# Patient Record
Sex: Male | Born: 1935 | Race: White | Hispanic: No | Marital: Married | Smoking: Former smoker
Health system: Southern US, Community
[De-identification: ages and names within clinical notes are randomized; demographics above are authoritative.]

## PROBLEM LIST (undated history)

## (undated) DIAGNOSIS — R531 Weakness: Secondary | ICD-10-CM

## (undated) HISTORY — DX: Weakness: R53.1

---

## 2001-06-15 ENCOUNTER — Encounter: Admission: RE | Admit: 2001-06-15 | Discharge: 2001-06-15 | Payer: Self-pay | Admitting: Family Medicine

## 2001-06-15 ENCOUNTER — Encounter: Payer: Self-pay | Admitting: Family Medicine

## 2003-01-20 ENCOUNTER — Encounter: Admission: RE | Admit: 2003-01-20 | Discharge: 2003-01-20 | Payer: Self-pay | Admitting: Family Medicine

## 2003-01-20 ENCOUNTER — Encounter: Payer: Self-pay | Admitting: Family Medicine

## 2006-06-21 ENCOUNTER — Encounter: Admission: RE | Admit: 2006-06-21 | Discharge: 2006-06-21 | Payer: Self-pay | Admitting: Family Medicine

## 2009-06-17 ENCOUNTER — Encounter: Admission: RE | Admit: 2009-06-17 | Discharge: 2009-06-17 | Payer: Self-pay | Admitting: Family Medicine

## 2009-08-13 ENCOUNTER — Inpatient Hospital Stay (HOSPITAL_COMMUNITY): Admission: EM | Admit: 2009-08-13 | Discharge: 2009-08-18 | Payer: Self-pay | Admitting: Emergency Medicine

## 2009-10-14 ENCOUNTER — Encounter: Admission: RE | Admit: 2009-10-14 | Discharge: 2009-10-14 | Payer: Self-pay | Admitting: Neurology

## 2009-11-03 ENCOUNTER — Encounter: Admission: RE | Admit: 2009-11-03 | Discharge: 2010-02-02 | Payer: Self-pay | Admitting: Family Medicine

## 2010-02-03 ENCOUNTER — Encounter: Admission: RE | Admit: 2010-02-03 | Discharge: 2010-05-06 | Payer: Self-pay | Admitting: Family Medicine

## 2010-05-07 ENCOUNTER — Encounter
Admission: RE | Admit: 2010-05-07 | Discharge: 2010-08-05 | Payer: Self-pay | Source: Home / Self Care | Admitting: Family Medicine

## 2010-12-07 LAB — CBC
HCT: 31.4 % — ABNORMAL LOW (ref 39.0–52.0)
HCT: 33.9 % — ABNORMAL LOW (ref 39.0–52.0)
Hemoglobin: 10.7 g/dL — ABNORMAL LOW (ref 13.0–17.0)
Hemoglobin: 11.2 g/dL — ABNORMAL LOW (ref 13.0–17.0)
Hemoglobin: 11.3 g/dL — ABNORMAL LOW (ref 13.0–17.0)
MCHC: 33.4 g/dL (ref 30.0–36.0)
MCHC: 34.2 g/dL (ref 30.0–36.0)
MCV: 99.5 fL (ref 78.0–100.0)
Platelets: 80 10*3/uL — ABNORMAL LOW (ref 150–400)
Platelets: 94 10*3/uL — ABNORMAL LOW (ref 150–400)
RBC: 3.17 MIL/uL — ABNORMAL LOW (ref 4.22–5.81)
RBC: 3.41 MIL/uL — ABNORMAL LOW (ref 4.22–5.81)
RDW: 14.7 % (ref 11.5–15.5)
RDW: 14.9 % (ref 11.5–15.5)
RDW: 15 % (ref 11.5–15.5)
WBC: 4.4 10*3/uL (ref 4.0–10.5)
WBC: 4.7 10*3/uL (ref 4.0–10.5)

## 2010-12-07 LAB — COMPREHENSIVE METABOLIC PANEL
ALT: 18 U/L (ref 0–53)
ALT: 21 U/L (ref 0–53)
AST: 17 U/L (ref 0–37)
AST: 18 U/L (ref 0–37)
AST: 20 U/L (ref 0–37)
Albumin: 3.5 g/dL (ref 3.5–5.2)
BUN: 16 mg/dL (ref 6–23)
CO2: 28 mEq/L (ref 19–32)
CO2: 29 mEq/L (ref 19–32)
Calcium: 8.9 mg/dL (ref 8.4–10.5)
Chloride: 101 mEq/L (ref 96–112)
Creatinine, Ser: 0.82 mg/dL (ref 0.4–1.5)
GFR calc Af Amer: >60 mL/min (ref >60)
GFR calc Af Amer: >60 mL/min (ref >60)
GFR calc non Af Amer: >60 mL/min (ref >60)
Glucose, Bld: 105 mg/dL — ABNORMAL HIGH (ref 70–99)
Potassium: 4 mEq/L (ref 3.5–5.1)
Sodium: 135 mEq/L (ref 135–145)
Sodium: 142 mEq/L (ref 135–145)
Total Bilirubin: 0.6 mg/dL (ref 0.3–1.2)
Total Protein: 5.3 g/dL — ABNORMAL LOW (ref 6.0–8.3)
Total Protein: 5.4 g/dL — ABNORMAL LOW (ref 6.0–8.3)
Total Protein: 6 g/dL (ref 6.0–8.3)

## 2010-12-07 LAB — URINE CULTURE: Colony Count: NO GROWTH

## 2010-12-07 LAB — DIFFERENTIAL
Basophils Absolute: 0 10*3/uL (ref 0.0–0.1)
Basophils Absolute: 0 10*3/uL (ref 0.0–0.1)
Basophils Relative: 1 % (ref 0–1)
Eosinophils Relative: 3 % (ref 0–5)
Eosinophils Relative: 4 % (ref 0–5)
Lymphocytes Relative: 22 % (ref 12–46)
Lymphocytes Relative: 25 % (ref 12–46)
Lymphs Abs: 0.9 10*3/uL (ref 0.7–4.0)
Monocytes Relative: 6 % (ref 3–12)
Neutro Abs: 3.2 10*3/uL (ref 1.7–7.7)
Neutro Abs: 3.5 10*3/uL (ref 1.7–7.7)
Neutrophils Relative %: 64 % (ref 43–77)
Neutrophils Relative %: 69 % (ref 43–77)

## 2010-12-07 LAB — URINALYSIS, ROUTINE W REFLEX MICROSCOPIC
Bilirubin Urine: NEGATIVE
Nitrite: NEGATIVE

## 2010-12-07 LAB — PROTIME-INR: INR: 1.08 (ref 0.00–1.49)

## 2010-12-07 LAB — POCT I-STAT, CHEM 8
BUN: 15 mg/dL (ref 6–23)
Chloride: 103 mEq/L (ref 96–112)
HCT: 34 % — ABNORMAL LOW (ref 39.0–52.0)
Hemoglobin: 11.6 g/dL — ABNORMAL LOW (ref 13.0–17.0)
Sodium: 140 mEq/L (ref 135–145)
TCO2: 28 mmol/L (ref 0–100)

## 2010-12-07 LAB — POCT CARDIAC MARKERS: Myoglobin, poc: 117 ng/mL (ref 12–200)

## 2010-12-07 LAB — MAGNESIUM: Magnesium: 2.1 mg/dL (ref 1.5–2.5)

## 2010-12-07 LAB — URINE MICROSCOPIC-ADD ON

## 2011-03-28 ENCOUNTER — Ambulatory Visit
Admission: RE | Admit: 2011-03-28 | Discharge: 2011-03-28 | Disposition: A | Discharge status: Home / Self Care | Payer: Medicare Other | Source: Ambulatory Visit | Attending: Family Medicine | Admitting: Family Medicine

## 2011-03-28 ENCOUNTER — Other Ambulatory Visit: Payer: Self-pay | Admitting: Family Medicine

## 2011-03-28 DIAGNOSIS — R1032 Left lower quadrant pain: Secondary | ICD-10-CM

## 2011-04-18 ENCOUNTER — Observation Stay (HOSPITAL_COMMUNITY)
Admission: RE | Admit: 2011-04-18 | Discharge: 2011-04-19 | Disposition: A | Discharge status: Home / Self Care | Payer: Medicare Other | Source: Ambulatory Visit | Attending: Urology | Admitting: Urology

## 2011-04-18 ENCOUNTER — Ambulatory Visit (HOSPITAL_COMMUNITY)
Admission: RE | Admit: 2011-04-18 | Discharge: 2011-04-18 | Disposition: A | Discharge status: Home / Self Care | Payer: Medicare Other | Source: Ambulatory Visit | Attending: Urology | Admitting: Urology

## 2011-04-18 DIAGNOSIS — Z79899 Other long term (current) drug therapy: Secondary | ICD-10-CM | POA: Insufficient documentation

## 2011-04-18 DIAGNOSIS — Z01812 Encounter for preprocedural laboratory examination: Secondary | ICD-10-CM | POA: Insufficient documentation

## 2011-04-18 DIAGNOSIS — N201 Calculus of ureter: Principal | ICD-10-CM | POA: Insufficient documentation

## 2011-04-18 LAB — BASIC METABOLIC PANEL
BUN: 12 mg/dL (ref 6–23)
CO2: 31 mEq/L (ref 19–32)
Creatinine, Ser: 0.73 mg/dL (ref 0.50–1.35)
Glucose, Bld: 99 mg/dL (ref 70–99)
Potassium: 3.6 mEq/L (ref 3.5–5.1)

## 2011-04-22 NOTE — Discharge Summary (Signed)
  [name], Alan NO.:  [id number]  MEDICAL RECORD NO.:  [id number]  LOCATION:  1445                         FACILITY:  [facility]  PHYSICIAN:  Danae Chen, M.D.  DATE OF BIRTH:  1929-09-09  DATE OF ADMISSION:  04/18/2011 DATE OF DISCHARGE:  04/19/2011                              DISCHARGE SUMMARY   DISCHARGE DIAGNOSIS:  Left ureteral calculus.  PROCEDURE DONE:  Extracorporeal shockwave lithotripsy, left ureteral calculus on April 18, 2011.  The patient is a 77 year old male who had ESL of left ureteral calculus on April 18, 2011.  He lives alone and did not have anybody to stay with him after the procedure.  He was admitted for observation since he could not go home and be by himself.  PHYSICAL EXAMINATION:  VITAL SIGNS:  His blood pressure was 163/77, pulse 76, respirations 18, and temperature 97.8. CHEST:  Lungs were clear. HEART:  Regular rhythm. ABDOMEN:  Soft, nondistended, and nontender.  Liver, spleen, and kidneys were not palpable.  The patient had ESL of the ureteral calculus on August 13, and postoperatively, he did fine.  He did not have any pain.  He was voiding well.  His urine was slightly bloody.  On August 14, his blood pressure was 119/64, pulse 60, respirations 18, and temperature 98.1.  He tolerated his diet well, and he was then discharged home on all his home medication.  He will be followed in the office in 3 weeks.  He is instructed to strain his urine to retrieve any stone fragments that he may pass, and he will be followed as an outpatient.  CONDITION ON DISCHARGE:  Improved.  DISCHARGE DIET:  Regular, and he may resume all his prehospital activities.     Danae Chen, M.D.     MN/MEDQ  D:  04/19/2011  T:  04/20/2011  Job:  [id number]  Electronically Signed by Lindaann Slough M.D. on 04/22/2011 10:53:33 AM

## 2013-04-26 ENCOUNTER — Ambulatory Visit (INDEPENDENT_AMBULATORY_CARE_PROVIDER_SITE_OTHER): Payer: Medicare Other | Admitting: Neurology

## 2013-04-26 ENCOUNTER — Encounter: Payer: Self-pay | Admitting: Neurology

## 2013-04-26 VITALS — BP 140/73 | HR 63 | Temp 97.3°F | Ht 70.0 in | Wt 150.0 lb

## 2013-04-26 DIAGNOSIS — G541 Lumbosacral plexus disorders: Secondary | ICD-10-CM

## 2013-04-26 DIAGNOSIS — Z9181 History of falling: Secondary | ICD-10-CM

## 2013-04-26 DIAGNOSIS — R269 Unspecified abnormalities of gait and mobility: Secondary | ICD-10-CM

## 2013-04-26 DIAGNOSIS — M479 Spondylosis, unspecified: Secondary | ICD-10-CM

## 2013-04-26 DIAGNOSIS — M6258 Muscle wasting and atrophy, not elsewhere classified, other site: Secondary | ICD-10-CM

## 2013-04-26 DIAGNOSIS — M6250 Muscle wasting and atrophy, not elsewhere classified, unspecified site: Secondary | ICD-10-CM

## 2013-04-26 DIAGNOSIS — R29898 Other symptoms and signs involving the musculoskeletal system: Secondary | ICD-10-CM

## 2013-04-26 DIAGNOSIS — R531 Weakness: Secondary | ICD-10-CM

## 2013-04-26 DIAGNOSIS — G609 Hereditary and idiopathic neuropathy, unspecified: Secondary | ICD-10-CM

## 2013-04-26 DIAGNOSIS — R5381 Other malaise: Secondary | ICD-10-CM

## 2013-04-26 DIAGNOSIS — Z8639 Personal history of other endocrine, nutritional and metabolic disease: Secondary | ICD-10-CM

## 2013-04-26 HISTORY — DX: Weakness: R53.1

## 2013-04-26 NOTE — Patient Instructions (Signed)
We will do blood work and MRI lumbar spine.  We may have to send you to a muscle and nerve specialist at [facility].

## 2013-04-26 NOTE — Progress Notes (Signed)
Subjective:    Patient ID: Alan [name] is a 79 y.o. male.  HPI  Huston Foley, MD, PhD [facility] [address], [address] [address] [place], [place] [address]  Dear Dr. Clovis Riley,   I saw your patient, [name], upon your kind request in my neurologic clinic today for initial consultation of his bilateral lower extremity weakness and numbness. The patient is unaccompanied by today. As you know, [name] is a very pleasant 79 year old right-handed gentleman with an underlying medical history of prostate hypertrophy, allergic rhinitis, eczema, irritable bowel syndrome and nephrolithiasis, who has had progressive bilateral lower extremity weakness for years. He had at least 2 prior EMG nerve conduction studies, most recently under Dr. Neale Burly on 03/21/2013. I reviewed the test: This was a significantly abnormal study with electrodiagnostic abnormalities observed in all nerves in most muscles, worse in comparison to a test from January 2011. The patient has previously seen my colleague Dr. Terrace Arabia and I reviewed her records as well. he has had extensive w/u in 2011, with x rays to the RLE, [place] to LEs to r/o DVT, he has had blood work including B12, normal CRP, ACE, lyme and lupus w/u. He was on B12 injections. He has been to [facility] with extesive w/u there including CSF testing. I do not have those test results available for review at this time. He has had several electrodiagnostic tests and CTH. He has been using a 2 wheeled walker. He fell in 5/14 and since then his L leg is weaker, which had been his stronger leg previously. He has no B/B dyscontrol and has numbness in both legs. He has chronic swelling in both lower extremities.    His Past Medical History Is Significant For: No past medical history on file.  His Past Surgical History Is Significant For: No past surgical history on file.  His Family History Is Significant For: Family History  Problem Relation  Age of Onset  . Congestive Heart Failure Mother     His Social History Is Significant For: History   Social History  . Marital Status: Married    Spouse Name: Glenola    Number of Children: 1  . Years of Education: HS   Occupational History  . Retired    Social History Main Topics  . Smoking status: Never Smoker   . Smokeless tobacco: Never Used  . Alcohol Use: No  . Drug Use: No  . Sexual Activity: None   Other Topics Concern  . None   Social History Narrative   Patient lives at home alone. Spouse lives in nursing home.   Caffeine Use: 2-3 cups of coffee daily    His Allergies Are:  Allergies  Allergen Reactions  . Erythromycin   . Etodolac   . Penicillins   :   His Current Medications Are:  Outpatient Encounter Prescriptions as of 04/26/2013  Medication Sig Dispense Refill  . cetirizine (ZYRTEC ALLERGY) 10 MG tablet Take 10 mg by mouth daily.      . diclofenac (VOLTAREN) 75 MG EC tablet Take 1 tablet by mouth 2 (two) times daily.      . diphenoxylate-atropine (LOMOTIL) 2.5-0.025 MG per tablet Take 1 tablet by mouth as needed.      . fluocinonide cream (LIDEX) 0.05 % Apply 1 application topically daily as needed.      Marland Kitchen HYDROcodone-acetaminophen (NORCO/VICODIN) 5-325 MG per tablet Take 1 tablet by mouth every 6 (six) hours as needed for pain.      Marland Kitchen  lidocaine (LIDODERM) 5 % Place 1 patch onto the skin daily. Remove & Discard patch within 12 hours or as directed by MD      . Multiple Vitamins-Minerals (CENTRUM SILVER ADULT 50+) TABS Take 1 tablet by mouth daily.      . Probiotic Product (PROBIOTIC DAILY PO) Take 1 capsule by mouth daily.      Marland Kitchen terazosin (HYTRIN) 5 MG capsule Take 1 capsule by mouth at bedtime.      . triamcinolone cream (KENALOG) 0.1 % Apply 1 application topically as needed.       No facility-administered encounter medications on file as of 04/26/2013.    Review of Systems  Musculoskeletal: Positive for myalgias, joint swelling and arthralgias.        Cramps  Allergic/Immunologic: Positive for environmental allergies.       Skin sensitivity  Neurological: Positive for weakness and numbness.  Hematological: Bruises/bleeds easily.    Objective:  Neurologic Exam  Physical Exam Physical Examination:   Filed Vitals:   04/26/13 0954  BP: 140/73  Pulse: 63  Temp: 97.3 F (36.3 C)    General Examination: The patient is a very pleasant 79 y.o. male in no acute distress. He appears frail and is situated is the chair and brought his 2 wheeled walker. He is adequately groomed.   HEENT: Normocephalic, atraumatic, pupils are equal, round and reactive to light and accommodation. Extraocular tracking is good without limitation to gaze excursion or nystagmus noted. Normal smooth pursuit is noted. Hearing is grossly intact. Face is symmetric with normal facial animation and normal facial sensation. Speech is clear with no dysarthria noted. There is no hypophonia. There is no lip, neck/head, jaw or voice tremor. Neck is supple with full range of passive and active motion. There are no carotid bruits on auscultation. Oropharynx exam reveals: mild mouth dryness, adequate dental hygiene.   Chest: Clear to auscultation without wheezing, rhonchi or crackles noted.  Heart: S1+S2+0, regular and normal without murmurs, rubs or gallops noted.   Abdomen: Soft, non-tender and non-distended with normal bowel sounds appreciated on auscultation.  Extremities: There is 2+ pitting edema in the distal lower extremities bilaterally.  Skin: Warm and dry without trophic changes noted. There are no varicose veins. Mild erythema and chronic stasis-like changes are noted in the distal lower extremities up to mid shin area.  Musculoskeletal: exam reveals no obvious joint deformities, tenderness or joint swelling or erythema.   Neurologically:  Mental status: The patient is awake, alert and oriented in all 4 spheres. His memory, attention, language and knowledge  are appropriate. There is no aphasia, agnosia, apraxia or anomia. Speech is clear with normal prosody and enunciation. Thought process is linear. Mood is mildly depressed and affect is constricted.  Cranial nerves are as described above under HEENT exam. In addition, shoulder shrug is normal with equal shoulder height noted. Motor exam: Fairly normal bulk and tone is noted in the upper extremities. Strength is about 4+ to 5 in both upper extremities. Reflexes are 1+ in the upper extremities. In the lower extremities he has significant muscle atrophy primarily approximately right more than left. Strength is 3/5 in both hip flexors, 4/5 elsewhere but he does have a significant right footdrop. Reflexes are absent in both lower extremities. He stands up with significant difficulty and has to push himself up. He relies on his 2 wheeled walker. He walks with a significant steppage type gait on the right. He walks slowly and cautiously. He turns in 3  steps. He's not able to do a toe stance or he'll stance and cannot do tandem walk. Sensory-wise he has decreased to all modalities in the distal lower extremities up to the knees bilaterally. Perhaps some reduction in pinprick sensation in the upper extremities. Fine motor skills are impaired globally but more so in the lower extremities.  Assessment and Plan:   In summary, ESSEX PERRY is a very pleasant 79 y.o.-year old male with a history of progressive lower extremity weakness, numbness, and atrophy. He may have a combination of severe neuropathy with superimposed plexopathy or radiculopathy and myopathy cannot be excluded given the proximal wasting. I'm perplexed by his history and exam and somewhat disconcerted by the fact that he has had extensive workup in the past and has seen at least 2 neuromuscular specialists without clear etiology. I had to tell him quite frankly that I am not sure how I may be able to help him at this juncture. I suggested a referral to  neuromuscular specialty at [facility] but he declined. I explained to him that there may be very little I can offer him at this time. Nevertheless I would like to proceed with some additional blood work and a repeat lumbar spine MRI. He would like to find out about the cost of these studies first. He indicates quite significant financial constraints since his wife had to be in a nursing home since a year and a half ago or 2 years ago. He indicates that he has only limited financial resources. I am afraid that I am not going to be able to help this very nice but unfortunate gentleman very much. He will call us back when he is ready to proceed with the imaging study and blood work. I suggested I see him back in 3 months to discuss the test results. He demonstrated understanding and voiced agreement.  Thank you very much for allowing me to participate in the care of this nice patient. If I can be of any further assistance to you please do not hesitate to call me at [phone].  Sincerely,   Huston Foley, MD, PhD

## 2013-07-10 ENCOUNTER — Ambulatory Visit: Payer: Medicare Other | Admitting: Neurology

## 2014-07-03 ENCOUNTER — Ambulatory Visit: Payer: Medicare Other | Admitting: Physical Therapy

## 2016-03-03 ENCOUNTER — Emergency Department (HOSPITAL_COMMUNITY): Payer: Medicare Other

## 2016-03-03 ENCOUNTER — Encounter (HOSPITAL_COMMUNITY): Payer: Self-pay

## 2016-03-03 ENCOUNTER — Encounter (HOSPITAL_COMMUNITY)
Admission: EM | Disposition: A | Discharge status: Skilled Nursing Facility | Payer: Self-pay | Source: Home / Self Care | Attending: Orthopaedic Surgery

## 2016-03-03 ENCOUNTER — Inpatient Hospital Stay (HOSPITAL_COMMUNITY)
Admission: EM | Admit: 2016-03-03 | Discharge: 2016-03-07 | DRG: 481 | Disposition: A | Discharge status: Skilled Nursing Facility | Payer: Medicare Other | Attending: Orthopaedic Surgery | Admitting: Orthopaedic Surgery

## 2016-03-03 ENCOUNTER — Emergency Department (HOSPITAL_COMMUNITY): Payer: Medicare Other | Admitting: Anesthesiology

## 2016-03-03 DIAGNOSIS — I872 Venous insufficiency (chronic) (peripheral): Secondary | ICD-10-CM | POA: Diagnosis present

## 2016-03-03 DIAGNOSIS — W010XXA Fall on same level from slipping, tripping and stumbling without subsequent striking against object, initial encounter: Secondary | ICD-10-CM | POA: Diagnosis present

## 2016-03-03 DIAGNOSIS — Z419 Encounter for procedure for purposes other than remedying health state, unspecified: Secondary | ICD-10-CM

## 2016-03-03 DIAGNOSIS — Z8249 Family history of ischemic heart disease and other diseases of the circulatory system: Secondary | ICD-10-CM | POA: Diagnosis not present

## 2016-03-03 DIAGNOSIS — S72402A Unspecified fracture of lower end of left femur, initial encounter for closed fracture: Principal | ICD-10-CM

## 2016-03-03 DIAGNOSIS — G629 Polyneuropathy, unspecified: Secondary | ICD-10-CM | POA: Diagnosis present

## 2016-03-03 DIAGNOSIS — T148XXA Other injury of unspecified body region, initial encounter: Secondary | ICD-10-CM

## 2016-03-03 DIAGNOSIS — R739 Hyperglycemia, unspecified: Secondary | ICD-10-CM | POA: Diagnosis present

## 2016-03-03 DIAGNOSIS — Z87891 Personal history of nicotine dependence: Secondary | ICD-10-CM | POA: Diagnosis not present

## 2016-03-03 DIAGNOSIS — S72462A Displaced supracondylar fracture with intracondylar extension of lower end of left femur, initial encounter for closed fracture: Secondary | ICD-10-CM | POA: Diagnosis present

## 2016-03-03 DIAGNOSIS — Y92512 Supermarket, store or market as the place of occurrence of the external cause: Secondary | ICD-10-CM | POA: Diagnosis not present

## 2016-03-03 DIAGNOSIS — D62 Acute posthemorrhagic anemia: Secondary | ICD-10-CM | POA: Diagnosis not present

## 2016-03-03 DIAGNOSIS — S72402D Unspecified fracture of lower end of left femur, subsequent encounter for closed fracture with routine healing: Secondary | ICD-10-CM | POA: Diagnosis not present

## 2016-03-03 DIAGNOSIS — W19XXXA Unspecified fall, initial encounter: Secondary | ICD-10-CM

## 2016-03-03 DIAGNOSIS — M25562 Pain in left knee: Secondary | ICD-10-CM | POA: Diagnosis present

## 2016-03-03 HISTORY — PX: ORIF FEMUR FRACTURE: SHX2119

## 2016-03-03 LAB — CBC WITH DIFFERENTIAL/PLATELET
BASOS ABS: 0 10*3/uL (ref 0.0–0.1)
Basophils Relative: 0 %
EOS PCT: 5 %
Eosinophils Absolute: 0.3 10*3/uL (ref 0.0–0.7)
HEMATOCRIT: 32.7 % — AB (ref 39.0–52.0)
Hemoglobin: 11.2 g/dL — ABNORMAL LOW (ref 13.0–17.0)
LYMPHS PCT: 16 %
Lymphs Abs: 0.8 10*3/uL (ref 0.7–4.0)
MCH: 33.6 pg (ref 26.0–34.0)
MCHC: 34.3 g/dL (ref 30.0–36.0)
MCV: 98.2 fL (ref 78.0–100.0)
MONO ABS: 0.3 10*3/uL (ref 0.1–1.0)
MONOS PCT: 6 %
Neutro Abs: 3.8 10*3/uL (ref 1.7–7.7)
Neutrophils Relative %: 73 %
PLATELETS: 95 10*3/uL — AB (ref 150–400)
RBC: 3.33 MIL/uL — ABNORMAL LOW (ref 4.22–5.81)
RDW: 13.9 % (ref 11.5–15.5)
WBC: 5.1 10*3/uL (ref 4.0–10.5)

## 2016-03-03 LAB — BASIC METABOLIC PANEL
ANION GAP: 4 — AB (ref 5–15)
BUN: 24 mg/dL — ABNORMAL HIGH (ref 6–20)
CALCIUM: 8.8 mg/dL — AB (ref 8.9–10.3)
CHLORIDE: 108 mmol/L (ref 101–111)
CO2: 29 mmol/L (ref 22–32)
Creatinine, Ser: 0.92 mg/dL (ref 0.61–1.24)
GFR calc Af Amer: >60 mL/min (ref >60)
GFR calc non Af Amer: >60 mL/min (ref >60)
GLUCOSE: 168 mg/dL — AB (ref 65–99)
Potassium: 4.1 mmol/L (ref 3.5–5.1)
Sodium: 141 mmol/L (ref 135–145)

## 2016-03-03 SURGERY — OPEN REDUCTION INTERNAL FIXATION (ORIF) DISTAL FEMUR FRACTURE
Anesthesia: General | Site: Leg Upper | Laterality: Left

## 2016-03-03 MED ORDER — PROPOFOL 10 MG/ML IV BOLUS
INTRAVENOUS | Status: AC
  Filled 2016-03-03: qty 20

## 2016-03-03 MED ORDER — FENTANYL CITRATE (PF) 100 MCG/2ML IJ SOLN: 25.0000 ug | INTRAMUSCULAR | Status: DC | PRN

## 2016-03-03 MED ORDER — PROPOFOL 10 MG/ML IV BOLUS
INTRAVENOUS | Status: DC | PRN
  Administered 2016-03-03: 100 mg via INTRAVENOUS

## 2016-03-03 MED ORDER — HYDROMORPHONE HCL 1 MG/ML IJ SOLN: 1.0000 mg | INTRAMUSCULAR | Status: DC | PRN

## 2016-03-03 MED ORDER — 0.9 % SODIUM CHLORIDE (POUR BTL) OPTIME
TOPICAL | Status: DC | PRN
  Administered 2016-03-03: 1000 mL

## 2016-03-03 MED ORDER — SUCCINYLCHOLINE CHLORIDE 20 MG/ML IJ SOLN
INTRAMUSCULAR | Status: DC | PRN
  Administered 2016-03-03: 100 mg via INTRAVENOUS

## 2016-03-03 MED ORDER — ONDANSETRON HCL 4 MG/2ML IJ SOLN
INTRAMUSCULAR | Status: AC
  Filled 2016-03-03: qty 2

## 2016-03-03 MED ORDER — STERILE WATER FOR IRRIGATION IR SOLN
Status: DC | PRN
  Administered 2016-03-03: 2000 mL

## 2016-03-03 MED ORDER — CHLORHEXIDINE GLUCONATE 4 % EX LIQD: 60.0000 mL | Freq: Once | CUTANEOUS | Status: DC

## 2016-03-03 MED ORDER — ZOLPIDEM TARTRATE 5 MG PO TABS: 5.0000 mg | ORAL_TABLET | Freq: Every evening | ORAL | Status: DC | PRN

## 2016-03-03 MED ORDER — DEXAMETHASONE SODIUM PHOSPHATE 10 MG/ML IJ SOLN
INTRAMUSCULAR | Status: DC | PRN
  Administered 2016-03-03: 10 mg via INTRAVENOUS

## 2016-03-03 MED ORDER — SODIUM CHLORIDE 0.9 % IV SOLN
INTRAVENOUS | Status: DC
  Administered 2016-03-03: 23:00:00 via INTRAVENOUS

## 2016-03-03 MED ORDER — PHENYLEPHRINE HCL 10 MG/ML IJ SOLN
INTRAMUSCULAR | Status: AC
  Filled 2016-03-03: qty 1

## 2016-03-03 MED ORDER — VITAMIN B-1 100 MG PO TABS
100.0000 mg | ORAL_TABLET | Freq: Every day | ORAL | Status: DC
  Administered 2016-03-04 – 2016-03-07 (×4): 100 mg via ORAL
  Filled 2016-03-03 (×4): qty 1

## 2016-03-03 MED ORDER — FUROSEMIDE 10 MG/ML IJ SOLN
20.0000 mg | Freq: Once | INTRAMUSCULAR | Status: AC
  Administered 2016-03-03: 20 mg via INTRAVENOUS
  Filled 2016-03-03: qty 4

## 2016-03-03 MED ORDER — LIDOCAINE HCL (CARDIAC) 20 MG/ML IV SOLN
INTRAVENOUS | Status: DC | PRN
  Administered 2016-03-03: 100 mg via INTRAVENOUS

## 2016-03-03 MED ORDER — CLINDAMYCIN PHOSPHATE 900 MG/50ML IV SOLN
INTRAVENOUS | Status: AC
  Filled 2016-03-03: qty 50

## 2016-03-03 MED ORDER — DEXAMETHASONE SODIUM PHOSPHATE 10 MG/ML IJ SOLN
INTRAMUSCULAR | Status: AC
  Filled 2016-03-03: qty 1

## 2016-03-03 MED ORDER — VITAMIN D3 25 MCG (1000 UNIT) PO TABS
1000.0000 [IU] | ORAL_TABLET | Freq: Every day | ORAL | Status: DC
  Administered 2016-03-04 – 2016-03-07 (×4): 1000 [IU] via ORAL
  Filled 2016-03-03 (×4): qty 1

## 2016-03-03 MED ORDER — ACETAMINOPHEN 325 MG PO TABS
650.0000 mg | ORAL_TABLET | Freq: Four times a day (QID) | ORAL | Status: DC | PRN
  Administered 2016-03-06: 650 mg via ORAL
  Filled 2016-03-03: qty 2

## 2016-03-03 MED ORDER — ACETAMINOPHEN 650 MG RE SUPP: 650.0000 mg | Freq: Four times a day (QID) | RECTAL | Status: DC | PRN

## 2016-03-03 MED ORDER — METHOCARBAMOL 500 MG PO TABS
500.0000 mg | ORAL_TABLET | Freq: Four times a day (QID) | ORAL | Status: DC | PRN
  Administered 2016-03-06: 500 mg via ORAL
  Filled 2016-03-03: qty 1

## 2016-03-03 MED ORDER — ROCURONIUM BROMIDE 100 MG/10ML IV SOLN
INTRAVENOUS | Status: AC
  Filled 2016-03-03: qty 1

## 2016-03-03 MED ORDER — ONDANSETRON HCL 4 MG PO TABS: 4.0000 mg | ORAL_TABLET | Freq: Four times a day (QID) | ORAL | Status: DC | PRN

## 2016-03-03 MED ORDER — HYDROMORPHONE HCL 1 MG/ML IJ SOLN
1.0000 mg | Freq: Once | INTRAMUSCULAR | Status: AC
  Administered 2016-03-03: 1 mg via INTRAVENOUS
  Filled 2016-03-03: qty 1

## 2016-03-03 MED ORDER — CLINDAMYCIN PHOSPHATE 600 MG/50ML IV SOLN
600.0000 mg | Freq: Four times a day (QID) | INTRAVENOUS | Status: AC
  Administered 2016-03-04 (×2): 600 mg via INTRAVENOUS
  Filled 2016-03-03 (×5): qty 50

## 2016-03-03 MED ORDER — ROCURONIUM BROMIDE 100 MG/10ML IV SOLN
INTRAVENOUS | Status: DC | PRN
  Administered 2016-03-03: 20 mg via INTRAVENOUS

## 2016-03-03 MED ORDER — POVIDONE-IODINE 10 % EX SWAB: 2.0000 "application " | Freq: Once | CUTANEOUS | Status: DC

## 2016-03-03 MED ORDER — DIPHENHYDRAMINE HCL 12.5 MG/5ML PO ELIX: 12.5000 mg | ORAL_SOLUTION | ORAL | Status: DC | PRN

## 2016-03-03 MED ORDER — METOCLOPRAMIDE HCL 5 MG/ML IJ SOLN: 5.0000 mg | Freq: Three times a day (TID) | INTRAMUSCULAR | Status: DC | PRN

## 2016-03-03 MED ORDER — PHENYLEPHRINE HCL 10 MG/ML IJ SOLN
INTRAMUSCULAR | Status: DC | PRN
  Administered 2016-03-03 (×7): 80 ug via INTRAVENOUS

## 2016-03-03 MED ORDER — METOCLOPRAMIDE HCL 5 MG PO TABS: 5.0000 mg | ORAL_TABLET | Freq: Three times a day (TID) | ORAL | Status: DC | PRN

## 2016-03-03 MED ORDER — METHOCARBAMOL 1000 MG/10ML IJ SOLN
500.0000 mg | Freq: Four times a day (QID) | INTRAVENOUS | Status: DC | PRN
  Filled 2016-03-03: qty 5

## 2016-03-03 MED ORDER — FUROSEMIDE 40 MG PO TABS
40.0000 mg | ORAL_TABLET | Freq: Every day | ORAL | Status: DC
  Administered 2016-03-04 – 2016-03-07 (×4): 40 mg via ORAL
  Filled 2016-03-03 (×4): qty 1

## 2016-03-03 MED ORDER — ONDANSETRON HCL 4 MG/2ML IJ SOLN: 4.0000 mg | Freq: Four times a day (QID) | INTRAMUSCULAR | Status: DC | PRN

## 2016-03-03 MED ORDER — FENTANYL CITRATE (PF) 250 MCG/5ML IJ SOLN
INTRAMUSCULAR | Status: AC
  Filled 2016-03-03: qty 5

## 2016-03-03 MED ORDER — SUGAMMADEX SODIUM 200 MG/2ML IV SOLN
INTRAVENOUS | Status: DC | PRN
  Administered 2016-03-03: 200 mg via INTRAVENOUS

## 2016-03-03 MED ORDER — ONDANSETRON HCL 4 MG/2ML IJ SOLN
INTRAMUSCULAR | Status: DC | PRN
  Administered 2016-03-03: 4 mg via INTRAVENOUS

## 2016-03-03 MED ORDER — FENTANYL CITRATE (PF) 100 MCG/2ML IJ SOLN
INTRAMUSCULAR | Status: DC | PRN
  Administered 2016-03-03 (×6): 25 ug via INTRAVENOUS

## 2016-03-03 MED ORDER — PHENYLEPHRINE HCL 10 MG/ML IJ SOLN
10.0000 mg | INTRAVENOUS | Status: DC | PRN
  Administered 2016-03-03: 50 ug/min via INTRAVENOUS

## 2016-03-03 MED ORDER — CLINDAMYCIN PHOSPHATE 900 MG/50ML IV SOLN
900.0000 mg | INTRAVENOUS | Status: AC
  Administered 2016-03-03: 900 mg via INTRAVENOUS

## 2016-03-03 MED ORDER — LIDOCAINE HCL (CARDIAC) 20 MG/ML IV SOLN
INTRAVENOUS | Status: AC
  Filled 2016-03-03: qty 5

## 2016-03-03 MED ORDER — VITAMIN C 500 MG PO TABS
500.0000 mg | ORAL_TABLET | Freq: Every day | ORAL | Status: DC
  Administered 2016-03-04 – 2016-03-07 (×4): 500 mg via ORAL
  Filled 2016-03-03 (×4): qty 1

## 2016-03-03 MED ORDER — LORATADINE 10 MG PO TABS
10.0000 mg | ORAL_TABLET | Freq: Every day | ORAL | Status: DC
  Administered 2016-03-04 – 2016-03-07 (×4): 10 mg via ORAL
  Filled 2016-03-03 (×4): qty 1

## 2016-03-03 MED ORDER — ONDANSETRON HCL 4 MG/2ML IJ SOLN: 4.0000 mg | Freq: Once | INTRAMUSCULAR | Status: DC | PRN

## 2016-03-03 MED ORDER — ONDANSETRON HCL 4 MG/2ML IJ SOLN
4.0000 mg | Freq: Once | INTRAMUSCULAR | Status: AC
  Administered 2016-03-03: 4 mg via INTRAVENOUS
  Filled 2016-03-03: qty 2

## 2016-03-03 MED ORDER — OXYCODONE HCL 5 MG PO TABS
5.0000 mg | ORAL_TABLET | ORAL | Status: DC | PRN
  Administered 2016-03-04 – 2016-03-07 (×6): 5 mg via ORAL
  Filled 2016-03-03 (×6): qty 1

## 2016-03-03 MED ORDER — LACTATED RINGERS IV SOLN
INTRAVENOUS | Status: DC | PRN
  Administered 2016-03-03 (×2): via INTRAVENOUS

## 2016-03-03 MED ORDER — RISAQUAD PO CAPS
1.0000 | ORAL_CAPSULE | Freq: Every day | ORAL | Status: DC
  Administered 2016-03-04 – 2016-03-07 (×4): 1 via ORAL
  Filled 2016-03-03 (×5): qty 1

## 2016-03-03 SURGICAL SUPPLY — 60 items
BAG SPEC THK2 15X12 ZIP CLS (MISCELLANEOUS) ×1
BAG ZIPLOCK 12X15 (MISCELLANEOUS) ×3 IMPLANT
BANDAGE ACE 4X5 VEL STRL LF (GAUZE/BANDAGES/DRESSINGS) ×2 IMPLANT
BANDAGE ACE 6X5 VEL STRL LF (GAUZE/BANDAGES/DRESSINGS) ×3 IMPLANT
BANDAGE ESMARK 6X9 LF (GAUZE/BANDAGES/DRESSINGS) ×1 IMPLANT
BNDG CMPR 9X6 STRL LF SNTH (GAUZE/BANDAGES/DRESSINGS) ×1
BNDG COHESIVE 4X5 TAN STRL (GAUZE/BANDAGES/DRESSINGS) ×1 IMPLANT
BNDG ESMARK 6X9 LF (GAUZE/BANDAGES/DRESSINGS) ×3
BNDG GAUZE ELAST 4 BULKY (GAUZE/BANDAGES/DRESSINGS) ×3 IMPLANT
CLOSURE WOUND 1/2 X4 (GAUZE/BANDAGES/DRESSINGS)
CLOTH 2% CHLOROHEXIDINE 3PK (PERSONAL CARE ITEMS) ×2 IMPLANT
CUFF TOURN SGL QUICK 34 (TOURNIQUET CUFF) ×6
CUFF TRNQT CYL 34X4X40X1 (TOURNIQUET CUFF) ×1 IMPLANT
DRAPE C-ARM 42X120 X-RAY (DRAPES) ×2 IMPLANT
DRAPE C-ARMOR (DRAPES) ×2 IMPLANT
DRAPE EXTREMITY T 121X128X90 (DRAPE) ×3 IMPLANT
DRAPE U-SHAPE 47X51 STRL (DRAPES) ×2 IMPLANT
DRSG EMULSION OIL 3X16 NADH (GAUZE/BANDAGES/DRESSINGS) ×1 IMPLANT
DRSG PAD ABDOMINAL 8X10 ST (GAUZE/BANDAGES/DRESSINGS) ×3 IMPLANT
DRSG XEROFORM 1X8 (GAUZE/BANDAGES/DRESSINGS) ×2 IMPLANT
ELECT REM PT RETURN 9FT ADLT (ELECTROSURGICAL) ×3
ELECTRODE REM PT RTRN 9FT ADLT (ELECTROSURGICAL) ×1 IMPLANT
FACESHIELD WRAPAROUND (MASK) ×6 IMPLANT
FACESHIELD WRAPAROUND OR TEAM (MASK) IMPLANT
GAUZE SPONGE 4X4 12PLY STRL (GAUZE/BANDAGES/DRESSINGS) ×3 IMPLANT
GAUZE XEROFORM 5X9 LF (GAUZE/BANDAGES/DRESSINGS) ×2 IMPLANT
GLOVE BIO SURGEON STRL SZ7.5 (GLOVE) ×3 IMPLANT
GLOVE BIOGEL PI IND STRL 8 (GLOVE) ×1 IMPLANT
GLOVE BIOGEL PI IND STRL 9 (GLOVE) IMPLANT
GLOVE BIOGEL PI INDICATOR 8 (GLOVE) ×2
GLOVE BIOGEL PI INDICATOR 9 (GLOVE) ×4
GLOVE ECLIPSE 8.0 STRL XLNG CF (GLOVE) ×3 IMPLANT
GOWN STRL REUS W/TWL XL LVL3 (GOWN DISPOSABLE) ×7 IMPLANT
IMMOBILIZER KNEE 20 (SOFTGOODS) ×2 IMPLANT
KIT BASIN OR (CUSTOM PROCEDURE TRAY) ×3 IMPLANT
NS IRRIG 1000ML POUR BTL (IV SOLUTION) ×3 IMPLANT
PACK TOTAL JOINT (CUSTOM PROCEDURE TRAY) ×3 IMPLANT
PADDING CAST ABS 4INX4YD NS (CAST SUPPLIES) ×2
PADDING CAST ABS 6INX4YD NS (CAST SUPPLIES) ×2
PADDING CAST ABS COTTON 4X4 ST (CAST SUPPLIES) IMPLANT
PADDING CAST ABS COTTON 6X4 NS (CAST SUPPLIES) IMPLANT
PLATE FEMUR LT DISTAL 6-HOLE (Plate) ×2 IMPLANT
POSITIONER SURGICAL ARM (MISCELLANEOUS) ×3 IMPLANT
SCREW CANC PT 90X6.0MM (Screw) ×2 IMPLANT
SCREW CORTICAL 4.5X42MM (Screw) ×4 IMPLANT
SCREW CORTICAL 4.5X44MM (Screw) ×4 IMPLANT
SCREW LOCK 85X5XSTNS TI (Screw) IMPLANT
SCREW LOCKING 40X5.0MM (Screw) ×2 IMPLANT
SCREW LOCKING 5.0X80MM (Screw) ×2 IMPLANT
SCREW LOCKING 5.0X85MM (Screw) ×6 IMPLANT
STAPLER VISISTAT 35W (STAPLE) ×2 IMPLANT
STRIP CLOSURE SKIN 1/2X4 (GAUZE/BANDAGES/DRESSINGS) ×1 IMPLANT
SUT MNCRL AB 4-0 PS2 18 (SUTURE) ×1 IMPLANT
SUT VIC AB 0 CT1 36 (SUTURE) ×2 IMPLANT
SUT VIC AB 1 CT1 36 (SUTURE) ×6 IMPLANT
SUT VIC AB 2-0 CT1 27 (SUTURE) ×6
SUT VIC AB 2-0 CT1 TAPERPNT 27 (SUTURE) ×2 IMPLANT
TOWEL OR 17X26 10 PK STRL BLUE (TOWEL DISPOSABLE) ×6 IMPLANT
WATER STERILE IRR 1500ML POUR (IV SOLUTION) ×5 IMPLANT
YANKAUER SUCT BULB TIP 10FT TU (MISCELLANEOUS) ×2 IMPLANT

## 2016-03-03 NOTE — Anesthesia Procedure Notes (Signed)
Procedure Name: Intubation Date/Time: 03/03/2016 7:53 PM Performed by: Wynonia SoursWALKER, Lenzi Marmo L Pre-anesthesia Checklist: Patient identified, Emergency Drugs available, Suction available, Patient being monitored and Timeout performed Patient Re-evaluated:Patient Re-evaluated prior to inductionOxygen Delivery Method: Circle system utilized Preoxygenation: Pre-oxygenation with 100% oxygen Intubation Type: IV induction Ventilation: Mask ventilation without difficulty Laryngoscope Size: Mac and 3 Grade View: Grade II Tube type: Oral Tube size: 7.5 mm Number of attempts: 1 Airway Equipment and Method: Stylet Placement Confirmation: ETT inserted through vocal cords under direct vision,  positive ETCO2,  CO2 detector and breath sounds checked- equal and bilateral Secured at: 23 cm Tube secured with: Tape Dental Injury: Teeth and Oropharynx as per pre-operative assessment

## 2016-03-03 NOTE — Anesthesia Preprocedure Evaluation (Signed)
Anesthesia Evaluation  Patient identified by MRN, date of birth, ID band Patient awake    Reviewed: Allergy & Precautions, NPO status , Patient's Chart, lab work & pertinent test results  Airway Mallampati: II  TM Distance: >3 FB Neck ROM: Full    Dental  (+) Teeth Intact, Dental Advisory Given, Chipped,    Pulmonary former smoker,    Pulmonary exam normal breath sounds clear to auscultation       Cardiovascular (-) hypertension(-) CAD and (-) Past MI negative cardio ROS Normal cardiovascular exam Rhythm:Regular Rate:Normal     Neuro/Psych Right drop foot, sciatica negative psych ROS   GI/Hepatic negative GI ROS, Neg liver ROS,   Endo/Other  negative endocrine ROS  Renal/GU negative Renal ROS     Musculoskeletal negative musculoskeletal ROS (+)   Abdominal   Peds  Hematology  (+) Blood dyscrasia, anemia ,   Anesthesia Other Findings Day of surgery medications reviewed with the patient.  Reproductive/Obstetrics                             Anesthesia Physical Anesthesia Plan  ASA: II  Anesthesia Plan: General   Post-op Pain Management:    Induction: Intravenous  Airway Management Planned: Oral ETT  Additional Equipment:   Intra-op Plan:   Post-operative Plan: Extubation in OR  Informed Consent: I have reviewed the patients History and Physical, chart, labs and discussed the procedure including the risks, benefits and alternatives for the proposed anesthesia with the patient or authorized representative who has indicated his/her understanding and acceptance.   Dental advisory given  Plan Discussed with: CRNA  Anesthesia Plan Comments: (Risks/benefits of general anesthesia discussed with patient including risk of damage to teeth, lips, gum, and tongue, nausea/vomiting, allergic reactions to medications, and the possibility of heart attack, stroke and death.  All patient  questions answered.  Patient wishes to proceed.)        Anesthesia Quick Evaluation

## 2016-03-03 NOTE — Consult Note (Signed)
Reason for Consult:  Left distal femur fracture Referring Physician:   Dr. Audie Pinto, EDP  Alan [name] is an 82 y.o. male.  HPI:   82 yo male who ambulates with a walker due to severe peripheral neuropathy.  He sustained an accidental mechanical fall today and developed severe left femur/knee pain and the inability to ambulate.  He was then transported to [facility] ED via EMS and found to have a complex left distal femur fracture.  This is is only complaint and he reports significant pain and spasms.  Past Medical History  Diagnosis Date  . Weakness 04/26/2013    History reviewed. No pertinent past surgical history.  Family History  Problem Relation Age of Onset  . Congestive Heart Failure Mother     Social History:  reports that he has quit smoking. He has never used smokeless tobacco. He reports that he does not drink alcohol or use illicit drugs.  Allergies:  Allergies  Allergen Reactions  . Erythromycin Diarrhea and Nausea And Vomiting  . Etodolac Other (See Comments)    unknown  . Penicillins Hives and Swelling    Has patient had a PCN reaction causing immediate rash, facial/tongue/throat swelling, SOB or lightheadedness with hypotension: yes Has patient had a PCN reaction causing severe rash involving mucus membranes or skin necrosis: no Has patient had a PCN reaction that required hospitalization: unknown Has patient had a PCN reaction occurring within the last 10 years: no If all of the above answers are "NO", then may proceed with Cephalosporin use.     Medications: I have reviewed the patient's current medications.  Results for orders placed or performed during the hospital encounter of 03/03/16 (from the past 48 hour(s))  CBC with Differential/Platelet     Status: Abnormal   Collection Time: 03/03/16 11:32 AM  Result Value Ref Range   WBC 5.1 4.0 - 10.5 K/uL   RBC 3.33 (L) 4.22 - 5.81 MIL/uL   Hemoglobin 11.2 (L) 13.0 - 17.0 g/dL   HCT 32.7 (L) 39.0 - 52.0 %    MCV 98.2 78.0 - 100.0 fL   MCH 33.6 26.0 - 34.0 pg   MCHC 34.3 30.0 - 36.0 g/dL   RDW 13.9 11.5 - 15.5 %   Platelets 95 (L) 150 - 400 K/uL    Comment: PLATELET COUNT CONFIRMED BY SMEAR   Neutrophils Relative % 73 %   Neutro Abs 3.8 1.7 - 7.7 K/uL   Lymphocytes Relative 16 %   Lymphs Abs 0.8 0.7 - 4.0 K/uL   Monocytes Relative 6 %   Monocytes Absolute 0.3 0.1 - 1.0 K/uL   Eosinophils Relative 5 %   Eosinophils Absolute 0.3 0.0 - 0.7 K/uL   Basophils Relative 0 %   Basophils Absolute 0.0 0.0 - 0.1 K/uL  Basic metabolic panel     Status: Abnormal   Collection Time: 03/03/16 11:32 AM  Result Value Ref Range   Sodium 141 135 - 145 mmol/L   Potassium 4.1 3.5 - 5.1 mmol/L   Chloride 108 101 - 111 mmol/L   CO2 29 22 - 32 mmol/L   Glucose, Bld 168 (H) 65 - 99 mg/dL   BUN 24 (H) 6 - 20 mg/dL   Creatinine, Ser 0.92 0.61 - 1.24 mg/dL   Calcium 8.8 (L) 8.9 - 10.3 mg/dL   GFR calc non Af Amer >60 >60 mL/min   GFR calc Af Amer >60 >60 mL/min    Comment: (NOTE) The eGFR has been calculated using  the CKD EPI equation. This calculation has not been validated in all clinical situations. eGFR's persistently <60 mL/min signify possible Chronic Kidney Disease.    Anion gap 4 (L) 5 - 15    Dg Knee 1-2 Views Left  03/03/2016  CLINICAL DATA:  Severe left knee pain after falling on uneven pavement onto both knees today. EXAM: LEFT KNEE - 1-2 VIEW COMPARISON:  None. FINDINGS: Comminuted, scratch sec comminuted and mildly impacted fracture of the distal femoral metaphysis with posterior displacement and marked posterior angulation of the distal fragment. There is also 3.5 cm of overlapping of the fragments. Diffuse osteopenia. IMPRESSION: Comminuted, displaced and markedly angulated distal femoral fracture, as described above. Electronically Signed   By: Claudie Revering M.D.   On: 03/03/2016 12:00   Dg Chest Portable 1 View  03/03/2016  CLINICAL DATA:  Preoperative evaluation, former smoker EXAM:  PORTABLE CHEST 1 VIEW COMPARISON:  Portable exam 1322 hours compared 08/13/2009 FINDINGS: Borderline enlargement of cardiac silhouette. Atherosclerotic calcification aorta. Mediastinal contours and pulmonary vascularity normal. Lungs clear. No pleural effusion or pneumothorax. Bowel interposition between liver and diaphragm. Mild osseous demineralization with endplate spur formation thoracic spine. IMPRESSION: Borderline enlargement of cardiac silhouette. No acute abnormalities. Aortic atherosclerosis. Electronically Signed   By: Lavonia Dana M.D.   On: 03/03/2016 13:33    Review of Systems  Musculoskeletal: Positive for joint pain and falls.  All other systems reviewed and are negative.  Blood pressure 163/75, pulse 68, temperature 97.9 F (36.6 C), temperature source Oral, resp. rate 15, SpO2 98 %. Physical Exam  Constitutional: He is oriented to person, place, and time. He appears well-developed and well-nourished.  HENT:  Head: Normocephalic and atraumatic.  Neck: Normal range of motion. Neck supple.  Cardiovascular: Normal rate.   Respiratory: Effort normal and breath sounds normal.  GI: Soft. Bowel sounds are normal.  Musculoskeletal:       Left knee: He exhibits swelling, effusion, ecchymosis, deformity and bony tenderness. Tenderness found. Medial joint line and lateral joint line tenderness noted.  Neurological: He is alert and oriented to person, place, and time.  Skin: Skin is warm and dry.  Psychiatric: He has a normal mood and affect.    Assessment/Plan: Complex comminuted closed left distal femur fracture post a mechanical fall 1)  I have spoken to the patient and his family in length and showed them his left femur x-rays.  He will need surgical fixation of this complex fracture this evening.  The risks and benefits have been discussed and explained in detail.  I will need to obtain a CT scan of his left knee first to assess for intra-articular extension of the fracture and  would like a consult from [facility] for pre-op surgical clearance.  Mcarthur Rossetti 03/03/2016, 3:23 PM

## 2016-03-03 NOTE — ED Notes (Signed)
Bed: WA22 Expected date:  Expected time:  Means of arrival:  Comments: EMS-fall 

## 2016-03-03 NOTE — Transfer of Care (Signed)
Immediate Anesthesia Transfer of Care Note  Patient: Alan [name]  Procedure(s) Performed: Procedure(s): OPEN REDUCTION INTERNAL FIXATION (ORIF) DISTAL FEMUR FRACTURE (Left)  Patient Location: PACU  Anesthesia Type:General  Level of Consciousness:  sedated, patient cooperative and responds to stimulation  Airway & Oxygen Therapy:Patient Spontanous Breathing and Patient connected to face mask oxgen  Post-op Assessment:  Report given to PACU RN and Post -op Vital signs reviewed and stable  Post vital signs:  Reviewed and stable  Last Vitals:  Filed Vitals:   03/03/16 1430 03/03/16 1622  BP: 163/75 128/71  Pulse: 68 90  Temp:    Resp: 15 18    Complications: No apparent anesthesia complications

## 2016-03-03 NOTE — Brief Op Note (Signed)
03/03/2016  9:16 PM  PATIENT:  Jeralene Petersarlton L Toops  82 y.o. male  PRE-OPERATIVE DIAGNOSIS:  left distal femur fracture  POST-OPERATIVE DIAGNOSIS:  left distal femur fracture  PROCEDURE:  Procedure(s): OPEN REDUCTION INTERNAL FIXATION (ORIF) DISTAL FEMUR FRACTURE (Left)  SURGEON:  Surgeon(s) and Role:    * Kathryne Hitchhristopher Y Erasmo Vertz, MD - Primary  PHYSICIAN ASSISTANT: Rexene EdisonGil Clark, PA-C  ANESTHESIA:   general  EBL:  Total I/O In: -  Out: 575 [Urine:500; Blood:75]  COUNTS:  YES  TOURNIQUET:  < 2 hrs  DICTATION: .Other Dictation: Dictation Number [id number]  PLAN OF CARE: Admit to inpatient   PATIENT DISPOSITION:  PACU - hemodynamically stable.   Delay start of Pharmacological VTE agent (>24hrs) due to surgical blood loss or risk of bleeding: no

## 2016-03-03 NOTE — ED Provider Notes (Addendum)
CSN: [id number]     Arrival date & time 03/03/16  1040 History   First MD Initiated Contact with Patient 03/03/16 1045     Chief Complaint  Patient presents with  . Fall      HPI  Expand All Collapse All   Per EMS-at store pushing cart-caught foot on uneven pavement-fell-right knee pain and swelling-did not hit head-no LOC        Past Medical History  Diagnosis Date  . Weakness 04/26/2013   History reviewed. No pertinent past surgical history. Family History  Problem Relation Age of Onset  . Congestive Heart Failure Mother    Social History  Substance Use Topics  . Smoking status: Former Games developermoker  . Smokeless tobacco: Never Used  . Alcohol Use: No    Review of Systems  All other systems reviewed and are negative.     Allergies  Erythromycin; Etodolac; and Penicillins  Home Medications   Prior to Admission medications   Medication Sig Start Date End Date Taking? Authorizing Provider  Ascorbic Acid (VITAMIN C PO) Take 1 tablet by mouth daily.   Yes Historical Provider, MD  cetirizine (ZYRTEC ALLERGY) 10 MG tablet Take 10 mg by mouth daily.   Yes Historical Provider, MD  Cholecalciferol (VITAMIN D PO) Take 1 tablet by mouth daily.   Yes Historical Provider, MD  diclofenac (VOLTAREN) 75 MG EC tablet Take 1 tablet by mouth 2 (two) times daily as needed for moderate pain.  02/14/13  Yes Historical Provider, MD  diphenoxylate-atropine (LOMOTIL) 2.5-0.025 MG per tablet Take 1 tablet by mouth 4 (four) times daily as needed for diarrhea or loose stools.  04/24/13  Yes Historical Provider, MD  furosemide (LASIX) 40 MG tablet Take 40 mg by mouth daily. 02/03/16  Yes Historical Provider, MD  HYDROcodone-acetaminophen (NORCO/VICODIN) 5-325 MG per tablet Take 1 tablet by mouth every 6 (six) hours as needed for pain.   Yes Historical Provider, MD  hydrocortisone cream 1 % Apply 1 application topically 2 (two) times daily as needed for itching.   Yes Historical Provider, MD  Loperamide  HCl (IMODIUM PO) Take 1 tablet by mouth 4 (four) times daily as needed (diarrhea).   Yes Historical Provider, MD  Multiple Vitamins-Minerals (CENTRUM SILVER ADULT 50+) TABS Take 1 tablet by mouth daily.   Yes Historical Provider, MD  Probiotic Product (PROBIOTIC DAILY PO) Take 1 capsule by mouth daily.   Yes Historical Provider, MD  thiamine (VITAMIN B-1) 100 MG tablet Take 100 mg by mouth daily.   Yes Historical Provider, MD   BP 163/75 mmHg  Pulse 68  Temp(Src) 97.9 F (36.6 C) (Oral)  Resp 15  SpO2 98% Physical Exam  Constitutional: He is oriented to person, place, and time. He appears well-developed and well-nourished. He appears distressed.  HENT:  Head: Normocephalic and atraumatic.  Eyes: Pupils are equal, round, and reactive to light.  Neck: Normal range of motion.  Cardiovascular: Normal rate and intact distal pulses.   Pulmonary/Chest: No respiratory distress.  Abdominal: Normal appearance. He exhibits no distension.  Musculoskeletal: He exhibits tenderness.       Left knee: He exhibits decreased range of motion, swelling, deformity and bony tenderness. Tenderness found.  Neurological: He is alert and oriented to person, place, and time. No cranial nerve deficit.  Skin: Skin is warm and dry. No rash noted.  Psychiatric: He has a normal mood and affect. His behavior is normal.  Nursing note and vitals reviewed.   ED Course  Procedures (  including critical care time) Medications  HYDROmorphone (DILAUDID) injection 1 mg (1 mg Intravenous Given 03/03/16 1127)  ondansetron (ZOFRAN) injection 4 mg (4 mg Intravenous Given 03/03/16 1127)    Labs Review Labs Reviewed  CBC WITH DIFFERENTIAL/PLATELET - Abnormal; Notable for the following:    RBC 3.33 (*)    Hemoglobin 11.2 (*)    HCT 32.7 (*)    Platelets 95 (*)    All other components within normal limits  BASIC METABOLIC PANEL - Abnormal; Notable for the following:    Glucose, Bld 168 (*)    BUN 24 (*)    Calcium 8.8 (*)     Anion gap 4 (*)    All other components within normal limits    Imaging Review Dg Knee 1-2 Views Left  03/03/2016  CLINICAL DATA:  Severe left knee pain after falling on uneven pavement onto both knees today. EXAM: LEFT KNEE - 1-2 VIEW COMPARISON:  None. FINDINGS: Comminuted, scratch sec comminuted and mildly impacted fracture of the distal femoral metaphysis with posterior displacement and marked posterior angulation of the distal fragment. There is also 3.5 cm of overlapping of the fragments. Diffuse osteopenia. IMPRESSION: Comminuted, displaced and markedly angulated distal femoral fracture, as described above. Electronically Signed   By: Beckie SaltsSteven  Reid M.D.   On: 03/03/2016 12:00   Dg Chest Portable 1 View  03/03/2016  CLINICAL DATA:  Preoperative evaluation, former smoker EXAM: PORTABLE CHEST 1 VIEW COMPARISON:  Portable exam 1322 hours compared 08/13/2009 FINDINGS: Borderline enlargement of cardiac silhouette. Atherosclerotic calcification aorta. Mediastinal contours and pulmonary vascularity normal. Lungs clear. No pleural effusion or pneumothorax. Bowel interposition between liver and diaphragm. Mild osseous demineralization with endplate spur formation thoracic spine. IMPRESSION: Borderline enlargement of cardiac silhouette. No acute abnormalities. Aortic atherosclerosis. Electronically Signed   By: Ulyses SouthwardMark  Boles M.D.   On: 03/03/2016 13:33   I have personally reviewed and evaluated these images and lab results as part of my medical decision-making.   EKG Interpretation   Date/Time:  Thursday March 03 2016 13:33:12 EDT Ventricular Rate:  62 PR Interval:    QRS Duration: 96 QT Interval:  424 QTC Calculation: 431 R Axis:   -24 Text Interpretation:  Sinus rhythm Borderline left axis deviation Low  voltage, precordial leads No previous tracing Confirmed by Remo Kirschenmann  MD,  Fujie Dickison ([id number]) on 03/03/2016 2:44:49 PM     Orthopedic surgery was consulted, Dr. Magnus IvanBlackman.  Came to the emergency  department and saw the patient.  He will admit for surgery.  The hospitalist service was also consult for medical consultation. MDM   Final diagnoses:  Closed fracture of distal end of left femur, unspecified fracture morphology, initial encounter (HCC)        Nelva Nayobert Victory Dresden, MD 03/03/16 1518  Nelva Nayobert Fraidy Mccarrick, MD 03/03/16 [phone]

## 2016-03-03 NOTE — Anesthesia Postprocedure Evaluation (Signed)
Anesthesia Post Note  Patient: Alan [name]  Procedure(s) Performed: Procedure(s) (LRB): OPEN REDUCTION INTERNAL FIXATION (ORIF) DISTAL FEMUR FRACTURE (Left)  Patient location during evaluation: PACU Anesthesia Type: General Level of consciousness: awake and alert Pain management: pain level controlled Vital Signs Assessment: post-procedure vital signs reviewed and stable Respiratory status: spontaneous breathing, nonlabored ventilation, respiratory function stable and patient connected to nasal cannula oxygen Cardiovascular status: blood pressure returned to baseline and stable Postop Assessment: no signs of nausea or vomiting Anesthetic complications: no    Last Vitals:  Filed Vitals:   03/03/16 2300 03/03/16 2313  BP: 129/75 114/66  Pulse: 86 81  Temp: 36.9 C 36.5 C  Resp: 23 18    Last Pain:  Filed Vitals:   03/03/16 2331  PainSc: 0-No pain                 Cecile HearingStephen Edward Turk

## 2016-03-03 NOTE — Consult Note (Signed)
Medical Consultation   Alan [name]  ZOX:[id number]  DOB: 06/16/1930  DOA: 03/03/2016  PCP: Alan Carneyean [name], MD (Confirm with patient/family/NH records and if not entered, this has to be entered at [facility] point [facility] entry)  Requesting physician: Dr. Magnus IvanBlackman of orthopedic surgery  Reason for consultation: Medical clearance prior to ORIF of left distal femur   History of Present Illness: Alan [name] is an 82 y.o. male with a history of peripheral neuropathy and chronic lower extremity edema who had a mechanical fall this afternoon and was found after evaluation in the ED today to have complex left distal femur fracture. He was seen by Dr. Magnus IvanBlackman in the ER who plans operative repair tonight, and requested [facility] consult for perioperative management of medical issues. History is provided by the patent as well as a couple of family members who are present. They say he does have chronic LE edema, and this is no worse than usual. He takes PO lasix on a PRN basis if his swelling worsens. The patient denies any recent cough, fever, nausea, shortness of breath or chest pain. He says he can walk as far as he needs to without chest pain, shortness of breath, or needing to rest for any reason. For example[place] [employer] and [place] walk all over the store without issue. He sleeps flat in bed at night and has no orthopnea. He currently has left leg pain but is otherwise completely asymptomatic.  Review of Systems:  ROS As per HPI otherwise 10 point review of systems negative.   Past Medical History: Past Medical History  Diagnosis Date  . Weakness 04/26/2013    Past Surgical History: History reviewed. No pertinent past surgical history.   Allergies:   Allergies  Allergen Reactions  . Erythromycin Diarrhea and Nausea And Vomiting  . Etodolac Other (See Comments)    unknown  . Penicillins Hives and Swelling    Has patient had a PCN reaction causing immediate rash,  facial/tongue/throat swelling, SOB or lightheadedness with hypotension: yes Has patient had a PCN reaction causing severe rash involving mucus membranes or skin necrosis: no Has patient had a PCN reaction that required hospitalization: unknown Has patient had a PCN reaction occurring within the last 10 years: no If all of the above answers are "NO", then may proceed with Cephalosporin use.      Social History:  reports that he has quit smoking. He has never used smokeless tobacco. He reports that he does not drink alcohol or use illicit drugs.   Family History: Family History  Problem Relation Age of Onset  . Congestive Heart Failure Mother     Unacceptable: Noncontributory, unremarkable, or negative. Acceptable: Family history reviewed and not pertinent (If you reviewed it)   Physical Exam: Filed Vitals:   03/03/16 1321 03/03/16 1350 03/03/16 1400 03/03/16 1430  BP: 173/86 173/86 154/92 163/75  Pulse: 78 60 63 68  Temp:      TempSrc:      Resp: 18 14 16 15   SpO2: 100% 100% 100% 98%    Constitutional: Alert and awake, oriented x3, not in any acute distress. Eyes: PERLA, EOMI, irises appear normal, anicteric sclera,  ENMT: external ears and nose appear normal, normal hearing             Lips appears normal, oropharynx mucosa, tongue, posterior pharynx appear normal  Neck: neck appears normal, no masses, normal ROM,  no thyromegaly, no JVD  CVS: S1-S2 clear, no murmur rubs or gallops, no LE edema, normal pedal pulses  Respiratory:  clear to auscultation bilaterally, no wheezing, rales or rhonchi. Respiratory effort normal. No accessory muscle use.  Abdomen: soft nontender, nondistended, normal bowel sounds, no hepatosplenomegaly, no hernias  Musculoskeletal: : no cyanosis, clubbing. He has 2+ pitting LE edema to mid shin without any evidence of rash, erythema, etc. There is clear swelling and bony abnormality of the left leg just above the knee. Neuro: Cranial nerves II-XII  intact, strength, sensation, reflexes Psych: judgement and insight appear normal, stable mood and affect, mental status Skin: no rashes or lesions or ulcers, no induration or nodules   Data reviewed:  I have personally reviewed following labs and imaging studies  Labs:  CBC:  Recent Labs Lab 03/03/16 1132  WBC 5.1  NEUTROABS 3.8  HGB 11.2*  HCT 32.7*  MCV 98.2  PLT 95*    Basic Metabolic Panel:  Recent Labs Lab 03/03/16 1132  NA 141  K 4.1  CL 108  CO2 29  GLUCOSE 168*  BUN 24*  CREATININE 0.92  CALCIUM 8.8*   GFR CrCl cannot be calculated (Unknown ideal weight.). Liver Function Tests: No results for input(s): AST, ALT, ALKPHOS, BILITOT, PROT, ALBUMIN in the last 168 hours. No results for input(s): LIPASE, AMYLASE in the last 168 hours. No results for input(s): AMMONIA in the last 168 hours. Coagulation profile No results for input(s): INR, PROTIME in the last 168 hours.  Cardiac Enzymes: No results for input(s): CKTOTAL, CKMB, CKMBINDEX, TROPONINI in the last 168 hours. BNP: Invalid input(s): POCBNP CBG: No results for input(s): GLUCAP in the last 168 hours. D-Dimer No results for input(s): DDIMER in the last 72 hours. Hgb A1c No results for input(s): HGBA1C in the last 72 hours. Lipid Profile No results for input(s): CHOL, HDL, LDLCALC, TRIG, CHOLHDL, LDLDIRECT in the last 72 hours. Thyroid function studies No results for input(s): TSH, T4TOTAL, T3FREE, THYROIDAB in the last 72 hours.  Invalid input(s): FREET3 Anemia work up No results for input(s): VITAMINB12, FOLATE, FERRITIN, TIBC, IRON, RETICCTPCT in the last 72 hours. Urinalysis    Component Value Date/Time   COLORURINE YELLOW 08/13/2009 1458   APPEARANCEUR CLOUDY* 08/13/2009 1458   LABSPEC 1.022 08/13/2009 1458   PHURINE 7.5 08/13/2009 1458   GLUCOSEU NEGATIVE 08/13/2009 1458   HGBUR NEGATIVE 08/13/2009 1458   BILIRUBINUR NEGATIVE 08/13/2009 1458   KETONESUR TRACE* 08/13/2009 1458    PROTEINUR 100* 08/13/2009 1458   UROBILINOGEN 1.0 08/13/2009 1458   NITRITE NEGATIVE 08/13/2009 1458   LEUKOCYTESUR NEGATIVE 08/13/2009 1458   EKG: Normal sinus without any acute segmental abnormalities.  Microbiology No results found for this or any previous visit (from the past 240 hour(s)).     Inpatient Medications:   Scheduled Meds: . furosemide  20 mg Intravenous Once  .  HYDROmorphone (DILAUDID) injection  1 mg Intravenous Once   Continuous Infusions:    Radiological Exams on Admission: Dg Knee 1-2 Views Left  03/03/2016  CLINICAL DATA:  Severe left knee pain after falling on uneven pavement onto both knees today. EXAM: LEFT KNEE - 1-2 VIEW COMPARISON:  None. FINDINGS: Comminuted, scratch sec comminuted and mildly impacted fracture of the distal femoral metaphysis with posterior displacement and marked posterior angulation of the distal fragment. There is also 3.5 cm of overlapping of the fragments. Diffuse osteopenia. IMPRESSION: Comminuted, displaced and markedly angulated distal femoral fracture, as described above. Electronically Signed   By:  Beckie SaltsSteven  Reid M.D.   On: 03/03/2016 12:00   Dg Chest Portable 1 View  03/03/2016  CLINICAL DATA:  Preoperative evaluation, former smoker EXAM: PORTABLE CHEST 1 VIEW COMPARISON:  Portable exam 1322 hours compared 08/13/2009 FINDINGS: Borderline enlargement of cardiac silhouette. Atherosclerotic calcification aorta. Mediastinal contours and pulmonary vascularity normal. Lungs clear. No pleural effusion or pneumothorax. Bowel interposition between liver and diaphragm. Mild osseous demineralization with endplate spur formation thoracic spine. IMPRESSION: Borderline enlargement of cardiac silhouette. No acute abnormalities. Aortic atherosclerosis. Electronically Signed   By: Ulyses SouthwardMark  Boles M.D.   On: 03/03/2016 13:33    Impression/Recommendations Principal Problem:   Fracture of distal end of left femur [facility](HCC)  82 year old healthy male with  history of peripheral neuropathy and chronic lower extremity edema with traumatic complex left distal femur fracture. Due to his lower extremity edema and elevated blood pressure, I will give him a one time dose of IV lasix. Otherwise, his chest xray and EKG are without acute or concerning findings and he has no cardiac or pulmonary symptoms or history which would warrant further investigation prior to operative repair of his femur. As such, I think the patient is an acceptable risk for this necessary surgery.  Thank you for this consultation.  Our [facility] hospital[facility] team will follow the patient with you.  Time Spent: 48 minutes  Chasitie Passey Vergie LivingMohammed Kylena Mole M.D. [facility] 03/03/2016, 4:09 PM

## 2016-03-03 NOTE — ED Notes (Addendum)
Per EMS-at store pushing cart-caught foot on uneven pavement-fell-right knee pain and swelling-did not hit head-no LOC

## 2016-03-04 DIAGNOSIS — D62 Acute posthemorrhagic anemia: Secondary | ICD-10-CM

## 2016-03-04 DIAGNOSIS — S72402D Unspecified fracture of lower end of left femur, subsequent encounter for closed fracture with routine healing: Secondary | ICD-10-CM

## 2016-03-04 LAB — CBC
HEMATOCRIT: 21.8 % — AB (ref 39.0–52.0)
HEMOGLOBIN: 7.7 g/dL — AB (ref 13.0–17.0)
MCH: 33.9 pg (ref 26.0–34.0)
MCHC: 35.3 g/dL (ref 30.0–36.0)
MCV: 96 fL (ref 78.0–100.0)
Platelets: 73 10*3/uL — ABNORMAL LOW (ref 150–400)
RBC: 2.27 MIL/uL — ABNORMAL LOW (ref 4.22–5.81)
RDW: 14.2 % (ref 11.5–15.5)
WBC: 8.4 10*3/uL (ref 4.0–10.5)

## 2016-03-04 LAB — BASIC METABOLIC PANEL
ANION GAP: 4 — AB (ref 5–15)
BUN: 26 mg/dL — ABNORMAL HIGH (ref 6–20)
CALCIUM: 8.2 mg/dL — AB (ref 8.9–10.3)
CHLORIDE: 107 mmol/L (ref 101–111)
CO2: 28 mmol/L (ref 22–32)
Creatinine, Ser: 0.89 mg/dL (ref 0.61–1.24)
GFR calc non Af Amer: >60 mL/min (ref >60)
Glucose, Bld: 174 mg/dL — ABNORMAL HIGH (ref 65–99)
Potassium: 4.1 mmol/L (ref 3.5–5.1)
SODIUM: 139 mmol/L (ref 135–145)

## 2016-03-04 LAB — PROTIME-INR
INR: 1.2 (ref 0.00–1.49)
Prothrombin Time: 14.9 seconds (ref 11.6–15.2)

## 2016-03-04 LAB — PREPARE RBC (CROSSMATCH)

## 2016-03-04 LAB — ABO/RH: ABO/RH(D): A POS

## 2016-03-04 MED ORDER — WARFARIN VIDEO: Freq: Once | Status: DC

## 2016-03-04 MED ORDER — ASPIRIN EC 325 MG PO TBEC
325.0000 mg | DELAYED_RELEASE_TABLET | Freq: Two times a day (BID) | ORAL | Status: DC
  Administered 2016-03-05 – 2016-03-07 (×5): 325 mg via ORAL
  Filled 2016-03-04 (×5): qty 1

## 2016-03-04 MED ORDER — SODIUM CHLORIDE 0.9 % IV SOLN
Freq: Once | INTRAVENOUS | Status: AC
  Administered 2016-03-04: 21:00:00 via INTRAVENOUS

## 2016-03-04 MED ORDER — WARFARIN - PHARMACIST DOSING INPATIENT: Freq: Every day | Status: DC

## 2016-03-04 MED ORDER — WARFARIN SODIUM 5 MG PO TABS
5.0000 mg | ORAL_TABLET | Freq: Once | ORAL | Status: DC
  Filled 2016-03-04: qty 1

## 2016-03-04 MED ORDER — POLYSACCHARIDE IRON COMPLEX 150 MG PO CAPS
150.0000 mg | ORAL_CAPSULE | Freq: Two times a day (BID) | ORAL | Status: DC
  Administered 2016-03-04 – 2016-03-07 (×6): 150 mg via ORAL
  Filled 2016-03-04 (×6): qty 1

## 2016-03-04 MED ORDER — CLINDAMYCIN PHOSPHATE 600 MG/50ML IV SOLN
600.0000 mg | Freq: Once | INTRAVENOUS | Status: AC
  Administered 2016-03-04: 600 mg via INTRAVENOUS
  Filled 2016-03-04: qty 50

## 2016-03-04 MED ORDER — FUROSEMIDE 10 MG/ML IJ SOLN
20.0000 mg | Freq: Once | INTRAMUSCULAR | Status: DC
  Filled 2016-03-04: qty 2

## 2016-03-04 NOTE — NC FL2 (Signed)
[place] MEDICAID FL2 LEVEL OF CARE SCREENING TOOL     IDENTIFICATION  Patient Name: Alan [name] Birthdate: 05/07/1930 Sex: male Admission Date (Current Location): 03/03/2016  [facility] [place]   Facility and Address:  [facility],  [address], [place] [id number]      Provider Number: [id number]  Attending Physician Name and Address:  Kathryne Hitchhristopher Y Blackman, *  Relative Name and Phone Number:       Current Level of Care: Hospital Recommended Level of Care: Skilled Nursing Facility Prior Approval Number:    Date Approved/Denied:   PASRR Number: [id number]  Discharge Plan: SNF    Current Diagnoses: Patient Active Problem List   Diagnosis Date Noted  . Fracture of distal end of left femur (HCC) 03/03/2016  . Disp supracondyl fx with intracondylar extension of left distal femur (HCC) 03/03/2016  . Weakness 04/26/2013    Orientation RESPIRATION BLADDER Height & Weight     Self, Time, Situation, Place  Normal Indwelling catheter Weight: 68.493 kg (151 lb) Height:  5\' 9"  (175.3 cm)  BEHAVIORAL SYMPTOMS/MOOD NEUROLOGICAL BOWEL NUTRITION STATUS  Other (Comment) (no behaviors)   Continent Diet  AMBULATORY STATUS COMMUNICATION OF NEEDS Skin   Extensive Assist Verbally Surgical wounds, Skin abrasions                       Personal Care Assistance Level of Assistance  Bathing, Feeding, Dressing Bathing Assistance: Limited assistance Feeding assistance: Independent Dressing Assistance: Limited assistance     Functional Limitations Info  Sight, Hearing, Speech Sight Info: Adequate Hearing Info: Adequate Speech Info: Adequate    SPECIAL CARE FACTORS FREQUENCY  PT (By licensed PT), OT (By licensed OT)     PT Frequency: 5 x wk OT Frequency: 5 x wk            Contractures Contractures Info: Not present    Additional Factors Info  Code Status Code Status Info: Full Code             Current Medications  (03/04/2016):  This is the current hospital active medication list Current Facility-Administered Medications  Medication Dose Route Frequency Provider Last Rate Last Dose  . 0.9 %  sodium chloride infusion   Intravenous Continuous Kathryne Hitchhristopher Y Blackman, MD 75 mL/hr at 03/04/16 0600    . acetaminophen (TYLENOL) tablet 650 mg  650 mg Oral Q6H PRN Kathryne Hitchhristopher Y Blackman, MD       Or  . acetaminophen (TYLENOL) suppository 650 mg  650 mg Rectal Q6H PRN Kathryne Hitchhristopher Y Blackman, MD      . acidophilus (RISAQUAD) capsule 1 capsule  1 capsule Oral Daily Kathryne Hitchhristopher Y Blackman, MD      . cholecalciferol (VITAMIN D) tablet 1,000 Units  1,000 Units Oral Daily Kathryne Hitchhristopher Y Blackman, MD      . clindamycin (CLEOCIN) IVPB 600 mg  600 mg Intravenous Q6H Kathryne Hitchhristopher Y Blackman, MD   600 mg at 03/04/16 0839  . diphenhydrAMINE (BENADRYL) 12.5 MG/5ML elixir 12.5-25 mg  12.5-25 mg Oral Q4H PRN Kathryne Hitchhristopher Y Blackman, MD      . furosemide (LASIX) tablet 40 mg  40 mg Oral Daily Kathryne Hitchhristopher Y Blackman, MD      . HYDROmorphone (DILAUDID) injection 1 mg  1 mg Intravenous Q2H PRN Kathryne Hitchhristopher Y Blackman, MD      . loratadine (CLARITIN) tablet 10 mg  10 mg Oral Daily Kathryne Hitchhristopher Y Blackman, MD      . methocarbamol (ROBAXIN)  tablet 500 mg  500 mg Oral Q6H PRN Kathryne Hitchhristopher Y Blackman, MD       Or  . methocarbamol (ROBAXIN) 500 mg in dextrose 5 % 50 mL IVPB  500 mg Intravenous Q6H PRN Kathryne Hitchhristopher Y Blackman, MD      . metoCLOPramide (REGLAN) tablet 5-10 mg  5-10 mg Oral Q8H PRN Kathryne Hitchhristopher Y Blackman, MD       Or  . metoCLOPramide (REGLAN) injection 5-10 mg  5-10 mg Intravenous Q8H PRN Kathryne Hitchhristopher Y Blackman, MD      . ondansetron [facility](ZOFRAN) tablet 4 mg  4 mg Oral Q6H PRN Kathryne Hitchhristopher Y Blackman, MD       Or  . ondansetron [facility](ZOFRAN) injection 4 mg  4 mg Intravenous Q6H PRN Kathryne Hitchhristopher Y Blackman, MD      . oxyCODONE (Oxy IR/ROXICODONE) immediate release tablet 5-10 mg  5-10 mg Oral Q3H PRN Kathryne Hitchhristopher Y Blackman, MD   5 mg at  03/04/16 [id number]  . thiamine (VITAMIN B-1) tablet 100 mg  100 mg Oral Daily Kathryne Hitchhristopher Y Blackman, MD      . vitamin C (ASCORBIC ACID) tablet 500 mg  500 mg Oral Daily Kathryne Hitchhristopher Y Blackman, MD      . warfarin (COUMADIN) tablet 5 mg  5 mg Oral Once Lorenza EvangelistElizabeth R Green, RPH   5 mg at 03/04/16 1200  . zolpidem (AMBIEN) tablet 5 mg  5 mg Oral QHS PRN Kathryne Hitchhristopher Y Blackman, MD         Discharge Medications: Please see discharge summary for a list of discharge medications.  Relevant Imaging Results:  Relevant Lab Results:   Additional Information SS # [id number]  Keela Rubert, Dickey GaveJamie Lee, LCSW

## 2016-03-04 NOTE — Progress Notes (Signed)
Alan PetersCarlton L [name] is a 82 y.o. male patient admitted from PACU awake, alert - oriented  X 4 - no acute distress noted.  VSS - Blood pressure 114/66, pulse 81, temperature 97.7 F (36.5 C), temperature source Oral, resp. rate 18, height 5\' 9"  (1.753 m), weight 68.493 kg (151 lb), SpO2 100 %.    IV in place, occlusive dsg intact without redness.  Orientation to room, and floor completed with information packet given to patient/family.   Admission INP armband ID verified with patient/family, and in place.    fall assessment complete, with patient and family able to verbalize understanding of risk associated with falls, and verbalized understanding to call nsg before up out of bed.  Call light within reach, patient able to voice, and demonstrate understanding.  Skin, clean-dry- intact without evidence of bruising, skin tears on left arm from fall dressed.   No evidence of skin break down noted on exam.   Will cont to eval and treat per MD orders.  Alan HeftyPatricia D Ramirez, RN 03/04/2016 12:21 AM

## 2016-03-04 NOTE — Progress Notes (Signed)
[facility] PROGRESS NOTE  Alan [name]:[id number] DOB: 02/21/1930 DOA: 03/05/2016 PCP: Lupe Carney, MD  Interim summary and HPI 82 yo male who ambulates with a walker due to severe peripheral neuropathy. He sustained an accidental mechanical fall today and developed severe left femur/knee pain and the inability to ambulate. He was then transported to [facility] ED via EMS and found to have a complex left distal femur fracture. This is is only complaint and he reports significant pain and spasms.  S/P surgical repair. Acute blood loss anemia. Will need SNF for rehab. Stable and recovering appropriately.  Assessment/Plan: 1-left distal femur fracture: -as per primary team -tolerated procedure well -minimize narcotics and use IS/flutter valve  2-acute blood loss anemia: -close to 5 gram down post surgery -receiving transfusion today -follow Hgb trend -will start niferex  3-hyperglycemia  -will recommend to check A1C -outpatient follow up with PCP -if CBG's > 200, will benefit of SSI while inpatient  4-chronic Le edema: due to venous insufficiency -TED hoses/stocking socks -also continue lasix and further adjust his dose if swelling worsen/ interfering with his functionality   Code Status: Full Family Communication: no family at bedside Disposition Plan: per primary team; will anticipate SNf for rehab. Will sign off for now. Please contact us if further assistance needed.  Procedures: OPEN REDUCTION INTERNAL FIXATION (ORIF) DISTAL FEMUR FRACTURE (Left) 03-06-23  Antibiotics:  Cleocin for surgical prophylaxis   HPI/Subjective: Afebrile, no CP or SOB. Patient in no acute distress. Tolerated procedure well.  Objective: Filed Vitals:   03/04/16 1331 03/04/16 1600  BP: 113/54 115/54  Pulse: 72 78  Temp: 98.1 F (36.7 C) 98.3 F (36.8 C)  Resp: 16 16    Intake/Output Summary (Last 24 hours) at 03/04/16 1609 Last data filed at 03/04/16 1600  Gross per 24  hour  Intake 3514.25 ml  Output   1125 ml  Net 2389.25 ml   Filed Weights   05-Mar-2016 2332  Weight: 68.493 kg (151 lb)    Exam:   General:  Afebrile, denies CP and SOB. Reported pain in his left leg is 4-5/10  Cardiovascular: S1 and S2, no rubs or gallops  Respiratory: good air movement, no wheezing or crackles  Abdomen: soft, NT, ND, positive BS  Musculoskeletal: no edema, SCD's and left knee immobilizer in place   Data Reviewed: Basic Metabolic Panel:  Recent Labs Lab 05-Mar-2016 1132 03/04/16 0430  NA 141 139  K 4.1 4.1  CL 108 107  CO2 29 28  GLUCOSE 168* 174*  BUN 24* 26*  CREATININE 0.92 0.89  CALCIUM 8.8* 8.2*   CBC:  Recent Labs Lab 03-05-16 1132 03/04/16 0430  WBC 5.1 8.4  NEUTROABS 3.8  --   HGB 11.2* 7.7*  HCT 32.7* 21.8*  MCV 98.2 96.0  PLT 95* 73*   Studies: Dg Knee 1-2 Views Left  March 05, 2016  CLINICAL DATA:  Severe left knee pain after falling on uneven pavement onto both knees today. EXAM: LEFT KNEE - 1-2 VIEW COMPARISON:  None. FINDINGS: Comminuted, scratch sec comminuted and mildly impacted fracture of the distal femoral metaphysis with posterior displacement and marked posterior angulation of the distal fragment. There is also 3.5 cm of overlapping of the fragments. Diffuse osteopenia. IMPRESSION: Comminuted, displaced and markedly angulated distal femoral fracture, as described above. Electronically Signed   By: Beckie Salts M.D.   On: 2016/03/05 12:00   Ct Knee Left Wo Contrast  03-05-2016  CLINICAL DATA:  Left knee pain after falling  today. Distal femur fracture. EXAM: CT OF THE LEFT KNEE WITHOUT CONTRAST TECHNIQUE: Multidetector CT imaging of the left knee was performed according to the standard protocol. Multiplanar CT image reconstructions were also generated. COMPARISON:  Radiographs same date FINDINGS: Bones: The bones are demineralized. There is a comminuted intra-articular fracture of the distal femur. This has a comminuted transverse  component within the metaphysis which demonstrates significant apex anterior angulation (approximately 54 degrees). This component of the fracture is posteriorly displaced by up to 8 mm. There is distal intercondylar extension of the fracture with 12 mm of displacement of the articular surface of medial trochlea. The weight-bearing articular surface of the femoral condyles is spared. The proximal tibia, proximal fibula and patella are intact. Joint/cartilage: Large lipohemarthrosis. Minimal underlying degenerative changes for age. Ligaments: Not applicable for exam/indication. The cruciate ligaments appear grossly intact. Tendons/muscles: Unremarkable.  The extensor mechanism is intact. Neurovascular/other soft tissues: There is soft tissue edema extending superiorly from the popliteal fossa into the distal thigh, likely from capsular extravasation. No evidence of neurovascular injury on noncontrast imaging. IMPRESSION: 1. Comminuted, angulated and displaced fracture of the distal femur as described. This fracture demonstrates distal intercondylar extension, but no involvement of the weight-bearing articular surface of the femoral condyles. 2. The proximal tibia, proximal fibula and patella are intact. 3. Large lipohemarthrosis with probable capsular extravasation into the superior aspect of the popliteal fossa. Electronically Signed   By: Carey BullocksWilliam  Veazey M.D.   On: 03/03/2016 16:50   Dg Chest Portable 1 View  03/03/2016  CLINICAL DATA:  Preoperative evaluation, former smoker EXAM: PORTABLE CHEST 1 VIEW COMPARISON:  Portable exam 1322 hours compared 08/13/2009 FINDINGS: Borderline enlargement of cardiac silhouette. Atherosclerotic calcification aorta. Mediastinal contours and pulmonary vascularity normal. Lungs clear. No pleural effusion or pneumothorax. Bowel interposition between liver and diaphragm. Mild osseous demineralization with endplate spur formation thoracic spine. IMPRESSION: Borderline enlargement of  cardiac silhouette. No acute abnormalities. Aortic atherosclerosis. Electronically Signed   By: Ulyses SouthwardMark  Boles M.D.   On: 03/03/2016 13:33   Dg C-arm 1-60 Min-no Report  03/03/2016  CLINICAL DATA: surgery C-ARM 1-60 MINUTES Fluoroscopy was utilized by the requesting physician.  No radiographic interpretation.   Dg Femur Min 2 Views Left  03/03/2016  CLINICAL DATA:  Open reduction internal fixation for distal femur fracture EXAM: DG C-ARM 1-60 MIN-NO REPORT; LEFT FEMUR 2 VIEWS COMPARISON:  CT left knee March 03, 2016 FLUOROSCOPY TIME:  1 minutes 26 seconds; 4 submitted images FINDINGS: Frontal and lateral views were obtained showing screw and plate fixation through a fracture of the distal femoral diaphysis. Alignment at fracture site near anatomic. No other fracture identified in the visualized femoral region. No dislocation. Knee joint spaces appear intact. IMPRESSION: Screw and plate fixation through distal femoral fracture with alignment at the fracture site near anatomic. No dislocation evident in the knee region. Electronically Signed   By: Bretta BangWilliam  Woodruff III M.D.   On: 03/03/2016 21:33    Scheduled Meds: . acidophilus  1 capsule Oral Daily  . cholecalciferol  1,000 Units Oral Daily  . clindamycin (CLEOCIN) IV  600 mg Intravenous Q6H  . furosemide  40 mg Oral Daily  . loratadine  10 mg Oral Daily  . thiamine  100 mg Oral Daily  . vitamin C  500 mg Oral Daily  . warfarin  5 mg Oral Once   Continuous Infusions: . sodium chloride Stopped (03/04/16 1316)    Time spent: 25 minutes    Vassie LollMadera, Radhika Dershem  [facility] Pager [phone]. If 7PM-7AM, please contact night-coverage at [email or web address], password [email or web address] 03/04/2016, 4:09 PM  LOS: 1 day

## 2016-03-04 NOTE — Progress Notes (Signed)
CSW assisting with d/c planning. CSW met with pt / daughter Amy at bedside. Daughter reports MD plans to d/c on Monday. Pt family aware [facility] will have an opening for pt on Monday. CSW will request authorization from [facility] for SNF placement.   Werner Lean LCSW [phone]

## 2016-03-04 NOTE — Evaluation (Addendum)
Occupational Therapy Evaluation Patient Details Name: Alan [name] MRN: [id number] DOB: [birth year] Today's Date: 03/04/2016    History of Present Illness Pt s/p fall with L distal femur fx and ORIF repair.  Pt with hx of bil LE peripheral neuropathy and "sciatic nerve problems" R LE including drop foot   Clinical Impression   This 82 year old man is s/p L ORIF with NWB.  He will benefit from continued OT to increase safety and independence with adls. Pt was mod I with adls prior to admission, but he struggled. Goals in acute are min A for SPT to BSC and LB bathing.  He will benefit from SNF after this stay    Follow Up Recommendations  SNF    Equipment Recommendations  3 in 1 bedside comode   Recommendations for Other Services       Precautions / Restrictions Precautions Precautions: Fall;Other (comment) Precaution Comments: ltd strength and knee flex at non-operative leg Required Braces or Orthoses: Knee Immobilizer - Left Knee Immobilizer - Left: On at all times Restrictions Weight Bearing Restrictions: Yes LLE Weight Bearing: Non weight bearing Other Position/Activity Restrictions: sponge bathes--has shower seat; high commode with vanity next to it.  tub.  RW and 4 wheel walker      Mobility Bed Mobility Overal bed mobility: Needs Assistance;+2 for physical assistance Bed Mobility: Supine to Sit;Sit to Supine     Supine to sit: Min assist;+2 for physical assistance;+2 for safety/equipment Sit to supine: Min assist;+2 for physical assistance;+2 for safety/equipment   General bed mobility comments: cues for sequence and assist to manage L LE and for trunk stability  Transfers Overall transfer level: Needs assistance Equipment used: Rolling walker (2 wheeled) Transfers: Sit to/from Stand Sit to Stand: Mod assist;+2 physical assistance;+2 safety/equipment;From elevated surface         General transfer comment: cues for transition position, use of UES to self  assist.  Bed used to elevate pt to standing position    Balance                                            ADL Overall ADL's : Needs assistance/impaired     Grooming: Set up;Sitting   Upper Body Bathing: Set up;Sitting   Lower Body Bathing: Maximal assistance;+2 for safety/equipment;+2 for physical assistance;Sit to/from stand   Upper Body Dressing : Minimal assistance;Sitting (gown, lines)   Lower Body Dressing: Maximal assistance;+2 for physical assistance;+2 for safety/equipment;Sit to/from stand                 General ADL Comments: Did not transfer to chair nor BSC this session. Pt has catheter and needs high chair as  he cannot bend RLE under him.  LLE used to be better leg.  Educated on AE but did not use it. Pt has reachers at home--has not used for [employer]     Vision     Perception     Praxis      Pertinent Vitals/Pain Pain Assessment: 0-10 Pain Score: 3  Pain Location: L distal thigh Pain Descriptors / Indicators: Aching;Sore Pain Intervention(s): Limited activity within patient's tolerance;Monitored during session;Premedicated before session;Ice applied     Hand Dominance     Extremity/Trunk Assessment Upper Extremity Assessment Upper Extremity Assessment: Overall WFL for tasks assessed   Lower Extremity Assessment per PT Lower Extremity Assessment: RLE deficits/detail;LLE deficits/detail RLE Deficits /  Details: Pt with limited strength (3/5 quads) and drop foot 2* "sciatic nerve problem"   Knee flex ltd to ~50 RLE Sensation: history of peripheral neuropathy LLE Deficits / Details: KI in place with significant edema noted distal LE LLE: Unable to fully assess due to immobilization LLE Sensation: history of peripheral neuropathy   Cervical / Trunk Assessment Cervical / Trunk Assessment: Normal   Communication Communication Communication: HOH   Cognition Arousal/Alertness: Awake/alert Behavior During Therapy: WFL for tasks  assessed/performed Overall Cognitive Status: Within Functional Limits for tasks assessed                     General Comments       Exercises       Shoulder Instructions      Home Living Family/patient expects to be discharged to:: Skilled nursing facility Living Arrangements: Alone                                      Prior Functioning/Environment Level of Independence: Independent with assistive device(s)        Comments: has been using walker for years; back issues:  bil nerve damage.  Had difficulty with socks.  wears velcro shoes    OT Diagnosis: Acute pain;Generalized weakness   OT Problem List: Decreased strength;Decreased activity tolerance;Decreased safety awareness;Decreased knowledge of use of DME or AE;Pain   OT Treatment/Interventions: Self-care/ADL training;DME and/or AE instruction;Patient/family education;Therapeutic activities    OT Goals(Current goals can be found in the care plan section) Acute Rehab OT Goals Patient Stated Goal: Regain IND OT Goal Formulation: With patient Time For Goal Achievement: 03/11/16 Potential to Achieve Goals: Good ADL Goals Pt Will Perform Lower Body Bathing: with min assist;with adaptive equipment;sit to/from stand Pt Will Transfer to Toilet: with min assist;stand pivot transfer;bedside commode Pt Will Perform Toileting - Clothing Manipulation and hygiene: with min assist;sit to/from stand  OT Frequency: Min 2X/week   Barriers to D/C:            Co-evaluation   Reason for Co-Treatment: For patient/therapist safety PT goals addressed during session: Mobility/safety with mobility OT goals addressed during session: ADL's and self-care      End of Session    Activity Tolerance: Patient tolerated treatment well Patient left: in bed;with call bell/phone within reach;with family/visitor present;with bed alarm set   Time: 1127-1200 OT Time Calculation (min): 33 min Charges:  OT General  Charges $OT Visit: 1 Procedure G-Codes:    Jamilett Ferrante 03/04/2016, 12:48 PM  Marica OtterMaryellen Marjie Chea, OTR/L [phone] 03/04/2016

## 2016-03-04 NOTE — Clinical Social Work Placement (Signed)
   CLINICAL SOCIAL WORK PLACEMENT  NOTE  Date:  03/04/2016  Patient Details  Name: Alan [name] MRN: [id number] Date of Birth: 06-06-1930  Clinical Social Work is seeking post-discharge placement for this patient at the Skilled  Nursing Facility level of care (*CSW will initial, date and re-position this form in  chart as items are completed):  Yes   Patient/family provided with [facility] Clinical Social Work Department's list of facilities offering this level of care within the geographic area requested by the patient (or if unable, by the patient's family).  Yes   Patient/family informed of their freedom to choose among providers that offer the needed level of care, that participate in Medicare, Medicaid or managed care program needed by the patient, have an available bed and are willing to accept the patient.  Yes   Patient/family informed of [facility]'s ownership interest in [facility] and [facility], as well as of the fact that they are under no obligation to receive care at these facilities.  PASRR submitted to EDS on 03/04/16     PASRR number received on 03/04/16     Existing PASRR number confirmed on       FL2 transmitted to all facilities in geographic area requested by pt/family on 03/04/16     FL2 transmitted to all facilities within larger geographic area on       Patient informed that his/her managed care company has contracts with or will negotiate with certain facilities, including the following:        Yes   Patient/family informed of bed offers received.  [facility]     Physician recommends and patient chooses bed at      Patient to be transferred to   on  .  Patient to be transferred to facility by       Patient family notified on   of transfer.  Name of family member notified:        PHYSICIAN       Additional Comment:    _______________________________________________ [name], [name], LCSW   [phone] 03/04/2016, 12:09 PM

## 2016-03-04 NOTE — Op Note (Signed)
NAMSelmer Dominion:  President, Fortunato              ACCOUNT NO.:  [id number]  MEDICAL RECORD NO.:  [id number]  LOCATION:  1613                         [facility]  PHYSICIAN:  Vanita PandaChristopher Y. Magnus IvanBlackman, M.D.DATE OF BIRTH:  03/26/1930  DATE OF PROCEDURE:  03/03/2016 DATE OF DISCHARGE:                              OPERATIVE REPORT   PREOPERATIVE DIAGNOSIS:  Left comminuted distal femur fracture with intercondylar extension.  POSTOPERATIVE DIAGNOSIS:  Left comminuted distal femur fracture with intercondylar extension.  PROCEDURE:  Open reduction and internal fixation of left distal femur fracture with intercondylar extension using periarticular locking plate and screws.  IMPLANTS:  [employer]ker AxSOS III distal femoral 6-hole locking plate.  SURGEON:  Vanita PandaChristopher Y. Magnus IvanBlackman, M.D.  ASSISTANT:  Richardean CanalGilbert Clark, PA-C.  ANESTHESIA:  General.  ANTIBIOTICS:  900 mg of clindamycin.  TOURNIQUET TIME:  Less than an hour and a half.  BLOOD LOSS:  100 mL.  COMPLICATIONS:  None.  INDICATIONS:  Mr. Beverely PaceBryant is an 82 year old gentleman with peripheral neuropathy, who ambulates with an unsteady gait in a walker, who sustained a mechanical fall earlier today.  He had the inability to ambulate and severe left leg pain around the knee and femur.  [facility] and found to have a comminuted distal femur fracture.  A CT scan confirmed an intra- articular extension.  Orthopedic Surgery was consulted and they recommended open reduction and internal fixation of this fracture given the intercondylar extension.  I explained in detail the risks of acute blood loss anemia, nerve and vessel injury, further fracture, infection and DVT.  He understands the risk of nonunion and the fact that he would be nonweightbearing on that left leg for over 3 months.  DESCRIPTION OF PROCEDURE:  After thorough discussion of risks and benefits of surgery and clearance from the Medical  Service, he was brought to the operating room and placed supine on the operating table. General anesthesia was then obtained.  A Foley catheter was placed.  A nonsterile sterile tourniquet was placed around his upper left thigh. His left thigh, knee and leg were prepped and draped with DuraPrep and sterile drapes including the sterile stockinette.  A time-out was called to identify the correct patient and correct left femur.  We then used an Esmarch to wrap out the leg and the tourniquet was inflated to 300 mm of pressure.  I then made a lateral incision and carried this proximally and distally.  I dissected down through the iliotibial band to expose the distal femur and the fracture itself mobilizing the vastus lateralis muscle.  I was able to easily identify the fracture and then using instruments, teased it into reduced position under direct visualization and fluoroscopy.  I then chose my 6-hole distal femoral locking plate. There was a periarticular plate and fashioned it to the lateral aspect of the femur.  We secured this with bicortical screws proximally and locking screws distally holding the fracture in reduced position and verified under fluoroscopy as well.  We then irrigated the soft tissue with normal saline solution, closed the deep tissue over the plate with the IP band with interrupted #1 Vicryl suture followed by 0  Vicryl in the deep tissue, 2-0 Vicryl in the subcutaneous tissue, and interrupted staples on the skin.  Xeroform and well-padded sterile dressing were applied.  He was placed in a knee immobilizer.  Tourniquet was let down. Toes were pinked nicely.  He was awakened, extubated and taken to the recovery room in stable condition.  All final counts were correct. There were no complications noted.  Of note, Richardean CanalGilbert Clark, PA-C assisted in the entire case.  His assistance was crucial for facilitating all aspects of this case.     Vanita Pandahristopher Y. Magnus IvanBlackman,  M.D.     CYB/MEDQ  D:  03/03/2016  T:  03/04/2016  Job:  [id number]

## 2016-03-04 NOTE — Evaluation (Signed)
Physical Therapy Evaluation Patient Details Name: Alan PetersCarlton L Kopko MRN: [id number] DOB: Jul 28, 1930 Today's Date: 03/04/2016   History of Present Illness  Pt s/p fall with L distal femur fx and ORIF repair.  Pt with hx of bil LE peripheral neuropathy and "sciatic nerve problems" R LE including drop foot  Clinical Impression  Pt s/p L distal femur fx presents with post op pain, pre-existing peripheral neuropathy, decreased strength/ROM bilat LEs and NWB status on L LE limiting functional mobility.  Pt would greatly benefit from follow up rehab at SNF level to maximize IND and safety prior to return home with ltd assist.    Follow Up Recommendations SNF    Equipment Recommendations  None recommended by PT    Recommendations for Other Services OT consult     Precautions / Restrictions Precautions Precautions: Fall;Other (comment) Precaution Comments: ltd strength and knee flex at non-operative leg Required Braces or Orthoses: Knee Immobilizer - Left Knee Immobilizer - Left: On at all times Restrictions Weight Bearing Restrictions: Yes LLE Weight Bearing: Non weight bearing Other Position/Activity Restrictions: sponge bathes--has shower seat; high commode with vanity next to it.  tub.  RW and 4 wheel walker      Mobility  Bed Mobility Overal bed mobility: Needs Assistance;+2 for physical assistance Bed Mobility: Supine to Sit;Sit to Supine     Supine to sit: Min assist;+2 for physical assistance;+2 for safety/equipment Sit to supine: Min assist;+2 for physical assistance;+2 for safety/equipment   General bed mobility comments: cues for sequence and assist to manage L LE and for trunk stability  Transfers Overall transfer level: Needs assistance Equipment used: Rolling walker (2 wheeled) Transfers: Sit to/from Stand Sit to Stand: Mod assist;+2 physical assistance;+2 safety/equipment;From elevated surface         General transfer comment: cues for transition position, use  of UES to self assist.  Bed used to elevate pt to standing position  Ambulation/Gait             General Gait Details: Pt stood with RW and min assist x 2 for ~90 sec.  Pt unable to step and maintain NWB on L.  Stairs            Wheelchair Mobility    Modified Rankin (Stroke Patients Only)       Balance                                             Pertinent Vitals/Pain Pain Assessment: 0-10 Pain Score: 3  Pain Location: L distal thigh Pain Descriptors / Indicators: Aching;Sore Pain Intervention(s): Limited activity within patient's tolerance;Monitored during session;Premedicated before session;Ice applied    Home Living Family/patient expects to be discharged to:: Unsure Living Arrangements: Alone                    Prior Function Level of Independence: Independent with assistive device(s)         Comments: has been using walker for years; back issues:  bil nerve damage     Hand Dominance        Extremity/Trunk Assessment   Upper Extremity Assessment: Overall WFL for tasks assessed           Lower Extremity Assessment: RLE deficits/detail;LLE deficits/detail RLE Deficits / Details: Pt with limited strength (3/5 quads) and drop foot 2* "sciatic nerve problem"   Knee flex ltd to ~  50 LLE Deficits / Details: KI in place with significant edema noted distal LE  Cervical / Trunk Assessment: Normal  Communication   Communication: HOH  Cognition Arousal/Alertness: Awake/alert Behavior During Therapy: WFL for tasks assessed/performed Overall Cognitive Status: Within Functional Limits for tasks assessed                      General Comments      Exercises        Assessment/Plan    PT Assessment Patient needs continued PT services  PT Diagnosis Difficulty walking   PT Problem List Decreased strength;Decreased range of motion;Decreased activity tolerance;Decreased balance;Decreased mobility;Decreased knowledge  of use of DME;Pain;Impaired sensation  PT Treatment Interventions DME instruction;Gait training;Functional mobility training;Therapeutic activities;Therapeutic exercise;Patient/family education   PT Goals (Current goals can be found in the Care Plan section) Acute Rehab PT Goals Patient Stated Goal: Regain IND PT Goal Formulation: With patient Time For Goal Achievement: 03/11/16 Potential to Achieve Goals: Good    Frequency Min 3X/week   Barriers to discharge        Co-evaluation PT/OT/SLP Co-Evaluation/Treatment: Yes Reason for Co-Treatment: For patient/therapist safety PT goals addressed during session: Mobility/safety with mobility OT goals addressed during session: ADL's and self-care       End of Session Equipment Utilized During Treatment: Gait belt;Left knee immobilizer Activity Tolerance: Patient tolerated treatment well;Patient limited by fatigue Patient left: in bed;with call bell/phone within reach;with family/visitor present Nurse Communication: Mobility status         Time: 1129-1200 PT Time Calculation (min) (ACUTE ONLY): 31 min   Charges:   PT Evaluation $PT Eval Low Complexity: 1 Procedure     PT G Codes:        Dicky Boer 03/04/2016, 12:18 PM

## 2016-03-04 NOTE — Progress Notes (Signed)
Subjective: 1 Day Post-Op Procedure(s) (LRB): OPEN REDUCTION INTERNAL FIXATION (ORIF) DISTAL FEMUR FRACTURE (Left) Patient reports pain as moderate.  Acute blood loss anemia from his fracture and surgery.  He would rather not take coumadin.  He does not have an allergy to aspirin and he confirms this  Objective: Vital signs in last 24 hours: Temp:  [97.7 F (36.5 C)-98.4 F (36.9 C)] 97.9 F (36.6 C) (06/30 0516) Pulse Rate:  [60-106] 80 (06/30 0516) Resp:  [13-23] 17 (06/30 0516) BP: (96-173)/(47-103) 96/47 mmHg (06/30 0516) SpO2:  [98 %-100 %] 100 % (06/30 0516) Weight:  [68.493 kg (151 lb)] 68.493 kg (151 lb) (06/29 2332)  Intake/Output from previous day: 06/29 0701 - 06/30 0700 In: 2401.3 [P.O.:60; I.V.:2291.3; IV Piggyback:50] Out: 975 [Urine:900; Blood:75] Intake/Output this shift:     Recent Labs  03/03/16 1132 03/04/16 0430  HGB 11.2* 7.7*    Recent Labs  03/03/16 1132 03/04/16 0430  WBC 5.1 8.4  RBC 3.33* 2.27*  HCT 32.7* 21.8*  PLT 95* 73*    Recent Labs  03/03/16 1132 03/04/16 0430  NA 141 139  K 4.1 4.1  CL 108 107  CO2 29 28  BUN 24* 26*  CREATININE 0.92 0.89  GLUCOSE 168* 174*  CALCIUM 8.8* 8.2*    Recent Labs  03/04/16 0430  INR 1.20    Intact pulses distally Dorsiflexion/Plantar flexion intact Compartment soft  Assessment/Plan: 1 Day Post-Op Procedure(s) (LRB): OPEN REDUCTION INTERNAL FIXATION (ORIF) DISTAL FEMUR FRACTURE (Left) Up with therapy Discharge to SNF  Will stop Coumadin and start aspirin. Will transfuse PRBCs today.  Kathryne HitchBLACKMAN,Adelise Buswell Y 03/04/2016, 9:43 AM

## 2016-03-04 NOTE — Progress Notes (Signed)
ANTICOAGULATION CONSULT NOTE - Initial Consult  Pharmacy Consult for Warfarin Indication: VTE prophylaxis  Allergies  Allergen Reactions  . Latex Rash  . Betadine [Povidone Iodine] Hives  . Erythromycin Diarrhea and Nausea And Vomiting  . Etodolac Other (See Comments)    unknown  . Penicillins Hives and Swelling    Has patient had a PCN reaction causing immediate rash, facial/tongue/throat swelling, SOB or lightheadedness with hypotension: yes Has patient had a PCN reaction causing severe rash involving mucus membranes or skin necrosis: no Has patient had a PCN reaction that required hospitalization: unknown Has patient had a PCN reaction occurring within the last 10 years: no If all of the above answers are "NO", then may proceed with Cephalosporin use.     Patient Measurements: Height: 5\' 9"  (175.3 cm) Weight: 151 lb (68.493 kg) IBW/kg (Calculated) : 70.7   Vital Signs: Temp: 97.9 F (36.6 C) (06/30 0516) Temp Source: Oral (06/30 0516) BP: 96/47 mmHg (06/30 0516) Pulse Rate: 80 (06/30 0516)  Labs:  Recent Labs  03/03/16 1132 03/04/16 0430  HGB 11.2*  --   HCT 32.7*  --   PLT 95*  --   LABPROT  --  14.9  INR  --  1.20  CREATININE 0.92  --     Estimated Creatinine Clearance: 58.9 mL/min (by C-G formula based on Cr of 0.92).   Medical History: Past Medical History  Diagnosis Date  . Weakness 04/26/2013    Medications:  Scheduled:  . acidophilus  1 capsule Oral Daily  . cholecalciferol  1,000 Units Oral Daily  . clindamycin (CLEOCIN) IV  600 mg Intravenous Q6H  . furosemide  40 mg Oral Daily  . loratadine  10 mg Oral Daily  . thiamine  100 mg Oral Daily  . vitamin C  500 mg Oral Daily  . warfarin  5 mg Oral Once  . Warfarin - Pharmacist Dosing Inpatient   Does not apply q1800   Infusions:  . sodium chloride 75 mL/hr at 03/04/16 0200    Assessment: 85 yoM s/p left distal femur fracture.  Warfarin per Rx.  INR=1.20, No albumin or LFTs this  admission. Goal of Therapy:  INR 2-3    Plan:  Warfarin 5 mg x1 today at 0800 Daily PT/INR Education  Lorenza EvangelistGreen, Yahshua Thibault R 03/04/2016,5:44 AM

## 2016-03-04 NOTE — Progress Notes (Signed)
Day nurse explained to night nurse Lasix was to be given between units. Patinet has not received lasix and is receiving second unit of blood. Night nurse paged Alan [name], hospitalist. Alan [name] is aware of patients VS as follows: 98.3, 77, 16, 118/59, 100% RA. Per Alan [name], hold lasix at this time. If patient has decreased urine output nurse will reassess patient and call Alan [name].

## 2016-03-04 NOTE — Discharge Instructions (Addendum)
No weight bearing at all on your left leg until further notice. New dry dressing as needed. He can bend his knee.

## 2016-03-04 NOTE — Clinical Social Work Note (Signed)
Clinical Social Work Assessment  Patient Details  Name: Alan [name] MRN: [id number] Date of Birth: [birth year]  Date of referral:  03/04/16               Reason for consult:  Facility Placement, Discharge Planning                Permission sought to share information with:  Chartered certified accountant granted to share information::  Yes, Verbal Permission Granted  Name::        Agency::     Relationship::     Contact Information:     Housing/Transportation Living arrangements for the past 2 months:  Single Family Home Source of Information:  Patient Patient Interpreter Needed:  None Criminal Activity/Legal Involvement Pertinent to Current Situation/Hospitalization:  No - Comment as needed Significant Relationships:  Adult Children Lives with:  Self Do you feel safe going back to the place where you live?  No Need for family participation in patient care:  No (Coment)  Care giving concerns: Pt's care cannot be managed at home following hospital d/c.   Social Worker assessment / plan:  Pt hospitalized on 03/03/16 with a left femur fx. Surgery was required and has been completed. CSW met with pt to assist with d/c planning needs. PT eval is pending. Pt feels ST Rehab will be needed at d/c. Pt lives alone and has limited support. SNF search has been initiated and bed offers are pending. Pt reports that his wife was at [facility] prior to her passing and he would like to have rehab at that facility. CSW has contacted [facility] and has received a bed offer. CSW will request authorization from [facility] once PT recommendations are available.   Employment status:  Retired Nurse, adult PT Recommendations:  Not assessed at this time Information / Referral to community resources:  [place]  Patient/Family's Response to care: Pt feels rehab is needed prior to returning home.  Patient/Family's Understanding of and  Emotional Response to Diagnosis, Current Treatment, and Prognosis:  Pt is aware of his medical status. He is pleased surgery went well. Pt is motivated to work with therapy. Emotional Assessment Appearance:  Appears stated age Attitude/Demeanor/Rapport:  Other (cooperative) Affect (typically observed):  Calm, Appropriate Orientation:  Oriented to Self, Oriented to Place, Oriented to  Time, Oriented to Situation Alcohol / Substance use:  Not Applicable Psych involvement (Current and /or in the community):  No (Comment)  Discharge Needs  Concerns to be addressed:  Discharge Planning Concerns Readmission within the last 30 days:  No Current discharge risk:  None Barriers to Discharge:  No Barriers Identified   Luretha Rued, [place] 03/04/2016, 11:37 AM

## 2016-03-05 LAB — CBC
HEMATOCRIT: 26.4 % — AB (ref 39.0–52.0)
HEMOGLOBIN: 9.1 g/dL — AB (ref 13.0–17.0)
MCH: 32.6 pg (ref 26.0–34.0)
MCHC: 34.5 g/dL (ref 30.0–36.0)
MCV: 94.6 fL (ref 78.0–100.0)
Platelets: 59 10*3/uL — ABNORMAL LOW (ref 150–400)
RBC: 2.79 MIL/uL — ABNORMAL LOW (ref 4.22–5.81)
RDW: 16.1 % — AB (ref 11.5–15.5)
WBC: 7.7 10*3/uL (ref 4.0–10.5)

## 2016-03-05 LAB — PROTIME-INR
INR: 1.23 (ref 0.00–1.49)
PROTHROMBIN TIME: 15.2 s (ref 11.6–15.2)

## 2016-03-05 LAB — TYPE AND SCREEN
ABO/RH(D): A POS
ANTIBODY SCREEN: NEGATIVE
UNIT DIVISION: 0
UNIT DIVISION: 0

## 2016-03-05 NOTE — Progress Notes (Signed)
Subjective: 2 Days Post-Op Procedure(s) (LRB): OPEN REDUCTION INTERNAL FIXATION (ORIF) DISTAL FEMUR FRACTURE (Left) Patient reports pain as moderate.  Making progress.  H/H responded with transfusion.  Objective: Vital signs in last 24 hours: Temp:  [98.1 F (36.7 C)-98.9 F (37.2 C)] 98.6 F (37 C) (07/01 0457) Pulse Rate:  [72-85] 85 (07/01 0457) Resp:  [16] 16 (07/01 0457) BP: (102-126)/(52-64) 126/61 mmHg (07/01 0457) SpO2:  [98 %-100 %] 98 % (07/01 0457)  Intake/Output from previous day: 06/30 0701 - 07/01 0700 In: 2067 [P.O.:740; I.V.:545; Blood:682; IV Piggyback:100] Out: 1575 [Urine:1575] Intake/Output this shift:     Recent Labs  03/03/16 1132 03/04/16 0430 03/05/16 0445  HGB 11.2* 7.7* 9.1*    Recent Labs  03/04/16 0430 03/05/16 0445  WBC 8.4 7.7  RBC 2.27* 2.79*  HCT 21.8* 26.4*  PLT 73* 59*    Recent Labs  03/03/16 1132 03/04/16 0430  NA 141 139  K 4.1 4.1  CL 108 107  CO2 29 28  BUN 24* 26*  CREATININE 0.92 0.89  GLUCOSE 168* 174*  CALCIUM 8.8* 8.2*    Recent Labs  03/04/16 0430 03/05/16 0445  INR 1.20 1.23    Intact pulses distally Dorsiflexion/Plantar flexion intact Incision: dressing C/D/I No cellulitis present Compartment soft  Assessment/Plan: 2 Days Post-Op Procedure(s) (LRB): OPEN REDUCTION INTERNAL FIXATION (ORIF) DISTAL FEMUR FRACTURE (Left) Up with therapy [facility]  Kahron Kauth Y 03/05/2016, 11:08 AM

## 2016-03-06 NOTE — Progress Notes (Signed)
Physical Therapy Treatment Patient Details Name: Alan [name] MRN: [id number] DOB: [birth year] Today's Date: 03/06/2016    History of Present Illness Pt s/p fall with L distal femur fx and ORIF repair.  Pt with hx of bil LE peripheral neuropathy and "sciatic nerve problems" R LE including drop foot    PT Comments    POD # 3 Assisted pt to EOB only due to pain level and anxiety.  Pt did not want to attempt transfer to recliner so assisted back to bed and performed b LE TE's.  Pt aware to wear KI all times but can be removed while in bed for ICE.  Pt aware no knee bending.  Pt will need ST Rehab at SNF prior to returning home.   Follow Up Recommendations  SNF     Equipment Recommendations       Recommendations for Other Services       Precautions / Restrictions Precautions Precautions: Fall Precaution Comments: ltd strength and knee flex at non-operative leg plus B Neuropathy.  NO knee ROM Required Braces or Orthoses: Knee Immobilizer - Left Knee Immobilizer - Left: On at all times;On when out of bed or walking Restrictions Weight Bearing Restrictions: Yes LLE Weight Bearing: Non weight bearing Other Position/Activity Restrictions: sponge bathes--has shower seat; high commode with vanity next to it.  tub.  RW and 4 wheel walker    Mobility  Bed Mobility Overal bed mobility: Needs Assistance;+2 for physical assistance Bed Mobility: Supine to Sit;Sit to Supine     Supine to sit: Max assist;+2 for physical assistance;+2 for safety/equipment Sit to supine: Max assist;+2 for physical assistance;+2 for safety/equipment   General bed mobility comments: cues for sequence and assist to manage L LE and for trunk stability.  Increased, increased time  Transfers                 General transfer comment: pt declined any OOB activity due to pain  Ambulation/Gait                 Stairs            Wheelchair Mobility    Modified Rankin (Stroke Patients  Only)       Balance                                    Cognition Arousal/Alertness: Awake/alert Behavior During Therapy: Anxious                        Exercises  B LE AP AAROM B LE knee presses B LE gluteal squeezes R LE HR AAROM R LE ABD/ADd AAROM R LE SAQ's L LE ABd/ADb AAROM with KI one ICE applied    General Comments        Pertinent Vitals/Pain Pain Assessment: 0-10 Pain Score: 10-Worst pain ever Pain Location: L distal thigh Pain Descriptors / Indicators: Discomfort;Grimacing;Stabbing Pain Intervention(s): Monitored during session;Premedicated before session;Repositioned;Ice applied    Home Living                      Prior Function            PT Goals (current goals can now be found in the care plan section) Progress towards PT goals: Progressing toward goals    Frequency  Min 3X/week    PT Plan Current plan remains appropriate    Co-evaluation  End of Session Equipment Utilized During Treatment: Gait belt;Left knee immobilizer Activity Tolerance: Patient limited by pain Patient left: in bed;with call bell/phone within reach;with family/visitor present     Time: 1610-96041407-1432 PT Time Calculation (min) (ACUTE ONLY): 25 min  Charges:  $Therapeutic Exercise: 8-22 mins $Therapeutic Activity: 8-22 mins                    G Codes:      Felecia ShellingLori Alyah Boehning  PTA [facility]  Acute  Rehab Pager      [phone]

## 2016-03-06 NOTE — Progress Notes (Signed)
Patient ID: [name] L [name], male   DOB: 10/30/1929, 82 y.o.   MRN: [id number] Doing great.  Plan to discharge to skilled nursing Monday/tomorrow.

## 2016-03-07 ENCOUNTER — Encounter (HOSPITAL_COMMUNITY): Payer: Self-pay | Admitting: Orthopaedic Surgery

## 2016-03-07 MED ORDER — HYDROCODONE-ACETAMINOPHEN 5-325 MG PO TABS: 1.0000 | ORAL_TABLET | ORAL | Status: AC | PRN

## 2016-03-07 MED ORDER — ASPIRIN 325 MG PO TBEC: 325.0000 mg | DELAYED_RELEASE_TABLET | Freq: Two times a day (BID) | ORAL | Status: AC

## 2016-03-07 MED ORDER — METHOCARBAMOL 500 MG PO TABS: 500.0000 mg | ORAL_TABLET | Freq: Four times a day (QID) | ORAL | Status: AC | PRN

## 2016-03-07 NOTE — Progress Notes (Signed)
Patient ID: [name] L [name], male   DOB: 1930/06/27, 82 y.o.   MRN: [id number] Doing well overall.  Left femur stable.  Can be discharged to skilled nursing facility.

## 2016-03-07 NOTE — Clinical Social Work Placement (Addendum)
   CLINICAL SOCIAL WORK PLACEMENT  NOTE  Date:  03/07/2016  Patient Details  Name: Alan PetersCarlton L Simone MRN: [id number] Date of Birth: February 23, 1930  Clinical Social Work is seeking post-discharge placement for this patient at the Skilled  Nursing Facility level of care (*CSW will initial, date and re-position this form in  chart as items are completed):  Yes   Patient/family provided with [facility] Clinical Social Work Department's list of facilities offering this level of care within the geographic area requested by the patient (or if unable, by the patient's family).  Yes   Patient/family informed of their freedom to choose among providers that offer the needed level of care, that participate in Medicare, Medicaid or managed care program needed by the patient, have an available bed and are willing to accept the patient.  Yes   Patient/family informed of [facility]'s ownership interest in [facility] and [facility], as well as of the fact that they are under no obligation to receive care at these facilities.  PASRR submitted to EDS on 03/04/16     PASRR number received on 03/04/16     Existing PASRR number confirmed on       FL2 transmitted to all facilities in geographic area requested by pt/family on 03/04/16     FL2 transmitted to all facilities within larger geographic area on       Patient informed that his/her managed care company has contracts with or will negotiate with certain facilities, including the following:        Yes   Patient/family informed of bed offers received.  [facility]     Physician recommends and patient chooses bed at      [facility].  Patient to be transferred to facility by [facility]     Patient family notified on 03/07/16 of transfer.  Name of family member notified:  SON     PHYSICIAN       Additional Comment: Pt / son are in agreement with d/[facility]. [facility] transport required. Medical necessity form completed. Pt / family are aware out of pocket costs may be associated with [facility] transport. D/C Summary sent to SNF for review. Scripts included in d/c packet. # for report provided to NSG. BCBS Medicare provided authorization for SNF placement. Ambulance transport approval has been requested.   _______________________________________________ [name], Meah Jiron Lee, LCSW  [phone] 03/07/2016, 2:58 PM

## 2016-03-07 NOTE — Discharge Summary (Signed)
Patient ID: NHAT HEARNE MRN: [id number] DOB/AGE: July 07, 1930 82 y.o.  Admit date: 03/03/2016 Discharge date: 03/07/2016  Admission Diagnoses:  Principal Problem:   Fracture of distal end of left femur (HCC) Active Problems:   Disp supracondyl fx with intracondylar extension of left distal femur [facility])   Discharge Diagnoses:  Same  Past Medical History  Diagnosis Date  . Weakness 04/26/2013    Surgeries: Procedure(s): OPEN REDUCTION INTERNAL FIXATION (ORIF) DISTAL FEMUR FRACTURE on 03/03/2016   Consultants: Treatment Team:  Kathryne Hitch, MD  Discharged Condition: Improved  Hospital Course: Alan [name] is an 82 y.o. male who was admitted 03/03/2016 for operative treatment ofFracture of distal end of left femur (HCC). Patient has severe unremitting pain that affects sleep, daily activities, and work/hobbies. After pre-op clearance the patient was taken to the operating room on 03/03/2016 and underwent  Procedure(s): OPEN REDUCTION INTERNAL FIXATION (ORIF) DISTAL FEMUR FRACTURE.    Patient was given perioperative antibiotics: Anti-infectives    Start     Dose/Rate Route Frequency Ordered Stop   03/04/16 1630  clindamycin (CLEOCIN) IVPB 600 mg     600 mg 100 mL/hr over 30 Minutes Intravenous  Once 03/04/16 1611 03/04/16 1703   03/04/16 0600  clindamycin (CLEOCIN) IVPB 900 mg     900 mg 100 mL/hr over 30 Minutes Intravenous On call to O.R. 03/03/16 1754 03/03/16 2000   03/04/16 0200  clindamycin (CLEOCIN) IVPB 600 mg     600 mg 100 mL/hr over 30 Minutes Intravenous Every 6 hours 03/03/16 2326 03/04/16 1430       Patient was given sequential compression devices, early ambulation, and chemoprophylaxis to prevent DVT.  Patient benefited maximally from hospital stay and there were no complications.    Recent vital signs: Patient Vitals for the past 24 hrs:  BP Temp Temp src Pulse Resp SpO2  03/07/16 0549 126/61 mmHg 97.8 F (36.6 C) Oral 80 16 99 %  03/06/16  2123 (!) 96/55 mmHg 97.8 F (36.6 C) Oral 75 16 99 %  03/06/16 1513 112/65 mmHg 97.6 F (36.4 C) Oral 82 16 99 %     Recent laboratory studies:  Recent Labs  03/05/16 0445  WBC 7.7  HGB 9.1*  HCT 26.4*  PLT 59*  INR 1.23     Discharge Medications:     Medication List    TAKE these medications        aspirin 325 MG EC tablet  Take 1 tablet (325 mg total) by mouth 2 (two) times daily after a meal.     CENTRUM SILVER ADULT 50+ Tabs  Take 1 tablet by mouth daily.     diclofenac 75 MG EC tablet  Commonly known as:  VOLTAREN  Take 1 tablet by mouth 2 (two) times daily as needed for moderate pain.     diphenoxylate-atropine 2.5-0.025 MG tablet  Commonly known as:  LOMOTIL  Take 1 tablet by mouth 4 (four) times daily as needed for diarrhea or loose stools.     furosemide 40 MG tablet  Commonly known as:  LASIX  Take 40 mg by mouth daily.     HYDROcodone-acetaminophen 5-325 MG tablet  Commonly known as:  NORCO/VICODIN  Take 1-2 tablets by mouth every 4 (four) hours as needed for moderate pain.     hydrocortisone cream 1 %  Apply 1 application topically 2 (two) times daily as needed for itching.     IMODIUM PO  Take 1 tablet by mouth 4 (four) times  daily as needed (diarrhea).     methocarbamol 500 MG tablet  Commonly known as:  ROBAXIN  Take 1 tablet (500 mg total) by mouth every 6 (six) hours as needed for muscle spasms.     PROBIOTIC DAILY PO  Take 1 capsule by mouth daily.     thiamine 100 MG tablet  Commonly known as:  VITAMIN B-1  Take 100 mg by mouth daily.     VITAMIN C PO  Take 1 tablet by mouth daily.     VITAMIN D PO  Take 1 tablet by mouth daily.     ZYRTEC ALLERGY 10 MG tablet  Generic drug:  cetirizine  Take 10 mg by mouth daily.        Diagnostic Studies: Dg Knee 1-2 Views Left  03/03/2016  CLINICAL DATA:  Severe left knee pain after falling on uneven pavement onto both knees today. EXAM: LEFT KNEE - 1-2 VIEW COMPARISON:  None.  FINDINGS: Comminuted, scratch sec comminuted and mildly impacted fracture of the distal femoral metaphysis with posterior displacement and marked posterior angulation of the distal fragment. There is also 3.5 cm of overlapping of the fragments. Diffuse osteopenia. IMPRESSION: Comminuted, displaced and markedly angulated distal femoral fracture, as described above. Electronically Signed   By: Beckie SaltsSteven  Reid M.D.   On: 03/03/2016 12:00   Ct Knee Left Wo Contrast  03/03/2016  CLINICAL DATA:  Left knee pain after falling today. Distal femur fracture. EXAM: CT OF THE LEFT KNEE WITHOUT CONTRAST TECHNIQUE: Multidetector CT imaging of the left knee was performed according to the standard protocol. Multiplanar CT image reconstructions were also generated. COMPARISON:  Radiographs same date FINDINGS: Bones: The bones are demineralized. There is a comminuted intra-articular fracture of the distal femur. This has a comminuted transverse component within the metaphysis which demonstrates significant apex anterior angulation (approximately 54 degrees). This component of the fracture is posteriorly displaced by up to 8 mm. There is distal intercondylar extension of the fracture with 12 mm of displacement of the articular surface of medial trochlea. The weight-bearing articular surface of the femoral condyles is spared. The proximal tibia, proximal fibula and patella are intact. Joint/cartilage: Large lipohemarthrosis. Minimal underlying degenerative changes for age. Ligaments: Not applicable for exam/indication. The cruciate ligaments appear grossly intact. Tendons/muscles: Unremarkable.  The extensor mechanism is intact. Neurovascular/other soft tissues: There is soft tissue edema extending superiorly from the popliteal fossa into the distal thigh, likely from capsular extravasation. No evidence of neurovascular injury on noncontrast imaging. IMPRESSION: 1. Comminuted, angulated and displaced fracture of the distal femur as  described. This fracture demonstrates distal intercondylar extension, but no involvement of the weight-bearing articular surface of the femoral condyles. 2. The proximal tibia, proximal fibula and patella are intact. 3. Large lipohemarthrosis with probable capsular extravasation into the superior aspect of the popliteal fossa. Electronically Signed   By: Carey BullocksWilliam  Veazey M.D.   On: 03/03/2016 16:50   Dg Chest Portable 1 View  03/03/2016  CLINICAL DATA:  Preoperative evaluation, former smoker EXAM: PORTABLE CHEST 1 VIEW COMPARISON:  Portable exam 1322 hours compared 08/13/2009 FINDINGS: Borderline enlargement of cardiac silhouette. Atherosclerotic calcification aorta. Mediastinal contours and pulmonary vascularity normal. Lungs clear. No pleural effusion or pneumothorax. Bowel interposition between liver and diaphragm. Mild osseous demineralization with endplate spur formation thoracic spine. IMPRESSION: Borderline enlargement of cardiac silhouette. No acute abnormalities. Aortic atherosclerosis. Electronically Signed   By: Ulyses SouthwardMark  Boles M.D.   On: 03/03/2016 13:33   Dg C-arm 1-60 Min-no Report  03/03/2016  CLINICAL DATA: surgery C-ARM 1-60 MINUTES Fluoroscopy was utilized by the requesting physician.  No radiographic interpretation.   Dg Femur Min 2 Views Left  03/03/2016  CLINICAL DATA:  Open reduction internal fixation for distal femur fracture EXAM: DG C-ARM 1-60 MIN-NO REPORT; LEFT FEMUR 2 VIEWS COMPARISON:  CT left knee March 03, 2016 FLUOROSCOPY TIME:  1 minutes 26 seconds; 4 submitted images FINDINGS: Frontal and lateral views were obtained showing screw and plate fixation through a fracture of the distal femoral diaphysis. Alignment at fracture site near anatomic. No other fracture identified in the visualized femoral region. No dislocation. Knee joint spaces appear intact. IMPRESSION: Screw and plate fixation through distal femoral fracture with alignment at the fracture site near anatomic. No  dislocation evident in the knee region. Electronically Signed   By: Bretta BangWilliam  Woodruff III M.D.   On: 03/03/2016 21:33    Disposition: to skilled nursing facility      Discharge Instructions    Call MD / Call 911    Complete by:  As directed   If you experience chest pain or shortness of breath, CALL 911 and be transported to the hospital emergency room.  If you develope a fever above 101 F, pus (white drainage) or increased drainage or redness at the wound, or calf pain, call your surgeon's office.     Constipation Prevention    Complete by:  As directed   Drink plenty of fluids.  Prune juice may be helpful.  You may use a stool softener, such as Colace (over the counter) 100 mg twice a day.  Use MiraLax (over the counter) for constipation as needed.     Diet - low sodium heart healthy    Complete by:  As directed      Discharge patient    Complete by:  As directed      Increase activity slowly as tolerated    Complete by:  As directed            Follow-up Information    Follow up with Kathryne HitchBLACKMAN,Raymel Cull Y, MD. Schedule an appointment as soon as possible for a visit in 2 weeks.   Specialty:  Orthopedic Surgery   Contact information:   [address] [place] [address] [phone]        Signed: Kathryne HitchBLACKMAN,Trong Gosling Y 03/07/2016, 7:07 AM

## 2016-06-08 ENCOUNTER — Encounter: Payer: Self-pay | Admitting: Rehabilitative and Restorative Service Providers"

## 2016-06-08 ENCOUNTER — Ambulatory Visit (INDEPENDENT_AMBULATORY_CARE_PROVIDER_SITE_OTHER): Payer: Medicare Other | Admitting: Rehabilitative and Restorative Service Providers"

## 2016-06-08 DIAGNOSIS — M6281 Muscle weakness (generalized): Secondary | ICD-10-CM | POA: Diagnosis not present

## 2016-06-08 DIAGNOSIS — R2681 Unsteadiness on feet: Secondary | ICD-10-CM

## 2016-06-08 NOTE — Therapy (Signed)
[facility] [place] [address] [place], [place], [address] Phone: [phone]   Fax:  [phone]  Physical Therapy Evaluation  Patient Details  Name: Alan [name] MRN: [id number] Date of Birth: 1930-01-04 Referring Provider: Dr Doneen Poisson  Encounter Date: 06/08/2016      PT End of Session - 06/08/16 1214    Visit Number 1   Number of Visits 12   Date for PT Re-Evaluation 07/20/16   PT Start Time 1106   PT Stop Time 1200   PT Time Calculation (min) 54 min   Activity Tolerance Patient tolerated treatment well      Past Medical History:  Diagnosis Date  . Weakness 04/26/2013    Past Surgical History:  Procedure Laterality Date  . ORIF FEMUR FRACTURE Left 03/03/2016   Procedure: OPEN REDUCTION INTERNAL FIXATION (ORIF) DISTAL FEMUR FRACTURE;  Surgeon: Kathryne Hitch, MD;  Location: [facility];  Service: Orthopedics;  Laterality: Left;    There were no vitals filed for this visit.       Subjective Assessment - 06/08/16 1112    Subjective Patient fell 03/02/16 sustainding fx of distal femur. Surgery for ORIF same day with plate and screws. He was discharged from hospital 03/07/16 and was discharged to [facility]. He was discharged home for 1 month until his weight bearing status was increased to 50% and re-admitted to [facility] for 10 days and then discharged home again 05/15/16.  Now can be WBAT.    Pertinent History 4-5 years ago nerve injury Rt LE unknown cause. He had drop foot Rt UE for several years and had been developing drop foot on Lt LE which has now increased on Lt. He is having AFO's fabricated which will be ready in ~ 3 weeks . Has been walking with walker for ~ 5 years due to drop foot Rt foot   Diagnostic tests xrays    Patient Stated Goals walk with walker wherever he wants to go    Currently in Pain? No/denies            [facility] PT Assessment - 06/08/16 0001      Assessment   Medical Diagnosis ORIF  s/p distal femur fx    Referring Provider Dr Doneen Poisson   Onset Date/Surgical Date 03/02/16   Hand Dominance Right   Next MD Visit 06/23/16   Prior Therapy yes     Restrictions   Weight Bearing Restrictions --  WBAT Lt LE      Balance Screen   Has the patient fallen in the past 6 months Yes   How many times? 1   Has the patient had a decrease in activity level because of a fear of falling?  Yes   Is the patient reluctant to leave their home because of a fear of falling?  Yes     Home Environment   Living Environment Private residence   Living Arrangements Alone  support of family - daughter Amy   Additional Comments apartment handicap accessable  - has tub bench and bars for tub as well as sliding board      Prior Function   Level of Independence Independent with basic ADLs   Vocation Retired   Leisure sedentary - wheelchair dependent TV; reading      Observation/Other Assessments   Focus on Therapeutic Outcomes (FOTO)  98% limitation      Observation/Other Assessments-Edema    Edema --  significant edema Lt > Rt LE pitting edema Lt  Sensation   Additional Comments tingling in bilat feet      Posture/Postural Control   Posture Comments head forward; shoudlers rounded; increased thoracic kyphosis; flexed forward at hips      AROM   Right/Left Hip --  limited end ranges bilat - hip ext neutral to (-) 10 deg   Right Knee Extension -3   Right Knee Flexion 124   Left Knee Extension -12   Left Knee Flexion 100   Right/Left Ankle --  DF (-) 10-15 degrees bilat      Strength   Overall Strength Comments --  assessed in sitting and supine    Right/Left Shoulder --  WFL's bilat for age/condition - uses UE's to assist w/ADL's    Right Hip Flexion 5/5   Right Hip Extension 4+/5   Right Hip ABduction 4+/5   Right Hip ADduction 5/5   Left Hip Flexion 4+/5   Left Hip Extension 4/5   Left Hip ABduction 4+/5   Left Hip ADduction 4+/5   Right Knee Flexion  5/5   Right Knee Extension 5/5   Left Knee Flexion 4+/5   Left Knee Extension 4+/5   Right/Left Ankle --  no active DF/inv/ever; 2/5 to 2+/5 strength in PF     Flexibility   Soft Tissue Assessment /Muscle Length --  assessed in supine    Hamstrings tight Rt ~ 75 deg; Lt 65 deg    ITB tight bilat      Palpation   Palpation comment tight to palpation along incision      Bed Mobility   Bed Mobility --  sit to supine and supine to sit independently using UE's      Transfers   Comments moves sit to stand and stand to sit independently with use of bilat UE's; transfers table to chair independently SBA only      Ambulation/Gait   Ambulation Distance (Feet) 80 Feet   Assistive device Rolling walker   Gait Pattern Decreased dorsiflexion - right;Decreased dorsiflexion - left;Right hip hike;Left hip hike;Right steppage;Left steppage;Decreased trunk rotation;Trunk flexed;Narrow base of support;Poor foot clearance - left;Poor foot clearance - right   Ambulation Surface Level   Gait velocity slow   Gait Comments ambulates w/ SBA      Wheelchair Mobility   Wheelchair Mobility --  can roll w/c fwd - difficulty with turns                   [facility] Adult PT Treatment/Exercise - 06/08/16 0001      Knee/Hip Exercises: Standing   Other Standing Knee Exercises standing balance with walker - working on posture and alignment cues to extend hips and trunk                 PT Education - 06/08/16 1212    Education provided Yes   Education Details to begin working on standing for 5-10 min with w/c behind him for safely standing at counter or with walker   Person(s) Educated Patient;Child(ren)  daughter Amy    Methods Explanation;Demonstration;Tactile cues;Verbal cues;Handout   Comprehension Verbalized understanding;Returned demonstration;Verbal cues required             PT Long Term Goals - 06/08/16 1220      PT LONG TERM GOAL #1   Title Improve standing balance and  alignment with patient to demonstrate more upright posture 07/20/16   Time 6   Period Weeks   Status New     PT LONG TERM GOAL #  2   Title Increase strength bilat hips and knees to 5-/5 to 5/5 07/20/16   Time 6   Period Weeks   Status New     PT LONG TERM GOAL #3   Title Increase standing balance with patient to stand unsupported for 1-2 min 07/20/16   Time 6   Period Weeks   Status New     PT LONG TERM GOAL #4   Title Independent in ambulatioin with rolling walker for household distances 07/20/16   Time 6   Period Weeks   Status New     PT LONG TERM GOAL #5   Title Independent in HEP 07/20/16   Time 6   Period Weeks   Status New     PT LONG TERM GOAL #6   Title Improve FOTO to </= 66% limitation 07/20/16   Time 6   Period Weeks   Status New               Plan - 06/08/16 1214    Clinical Impression Statement Patient presents with decreased ambulatory status and independence following fx of distal femur 03/02/16 with ORIF the day of the fracture. Patient has received inpatient and homehealth therapy and is now ready to proceed with WBAT Rt LE and presents to out patient for additional rehab to improve independence in gait and ADL's. He has limited ROM; strength in bilat LE's Rt > Lt. he has a history of drop foot Rt LE for the past 3-5 years and has develeped drop foot in Lt foot since time of fracture. Patient has been fitted for bilat AFO's which should arrive in ~3 weeks per daughter's report.  He has decreased stamina and balance; abnormal posture; decreased standing balance and gait safety.    Rehab Potential Good   PT Frequency 2x / week   PT Duration 6 weeks   PT Treatment/Interventions Neuromuscular re-education;Patient/family education;ADLs/Self Care Home Management;Cryotherapy;Electrical Stimulation;Iontophoresis 4mg /ml Dexamethasone;Moist Heat;Ultrasound;Dry needling;Manual techniques;Therapeutic activities;Therapeutic exercise   PT Next Visit Plan progress  with strenthening and gait training    Consulted and Agree with Plan of Care Patient      Patient will benefit from skilled therapeutic intervention in order to improve the following deficits and impairments:  Postural dysfunction, Improper body mechanics, Decreased range of motion, Decreased strength, Decreased mobility, Abnormal gait, Decreased activity tolerance  Visit Diagnosis: Unsteadiness - Plan: PT plan of care cert/re-cert  Muscle weakness (generalized) - Plan: PT plan of care cert/re-cert     Problem List Patient Active Problem List   Diagnosis Date Noted  . Fracture of distal end of left femur (HCC) 03/03/2016  . Disp supracondyl fx with intracondylar extension of left distal femur (HCC) 03/03/2016  . Weakness 04/26/2013    Chinara Hertzberg Rober Minion PT, MPH  06/08/2016, 12:27 PM  [facility] [address] [place], [place], [address] Phone: [phone]   Fax:  [phone]  Name: Alan [name] MRN: [id number] Date of Birth: [birth year]

## 2016-06-20 ENCOUNTER — Ambulatory Visit (INDEPENDENT_AMBULATORY_CARE_PROVIDER_SITE_OTHER): Payer: Self-pay | Admitting: Orthopaedic Surgery

## 2016-07-13 ENCOUNTER — Encounter (INDEPENDENT_AMBULATORY_CARE_PROVIDER_SITE_OTHER): Payer: Self-pay

## 2016-07-13 ENCOUNTER — Ambulatory Visit (INDEPENDENT_AMBULATORY_CARE_PROVIDER_SITE_OTHER): Payer: Self-pay

## 2016-07-13 ENCOUNTER — Ambulatory Visit (INDEPENDENT_AMBULATORY_CARE_PROVIDER_SITE_OTHER): Payer: Medicare Other | Admitting: Orthopaedic Surgery

## 2016-07-13 DIAGNOSIS — S72462D Displaced supracondylar fracture with intracondylar extension of lower end of left femur, subsequent encounter for closed fracture with routine healing: Secondary | ICD-10-CM

## 2016-07-14 NOTE — Progress Notes (Signed)
The patient was never seen on this day.  EPIC error

## 2018-04-11 DIAGNOSIS — R197 Diarrhea, unspecified: Secondary | ICD-10-CM | POA: Diagnosis not present

## 2018-06-10 IMAGING — CT CT KNEE*L* W/O CM
4 series · 16 of 33 positions shown, 19 images · non-contrast
Comparison: Radiographs same date

CLINICAL DATA: Left knee pain after falling today. Distal femur
fracture.

EXAM:
CT OF THE LEFT KNEE WITHOUT CONTRAST
TECHNIQUE: Multidetector CT imaging of the left knee was performed according to
the standard protocol. Multiplanar CT image reconstructions were
also generated.

[Series 3: knee st · axial · 0.49mm/px · z∈[-298,-162]mm · 6 of 96 slices shown, 8 images]
[im 14/96  soft-tissue]
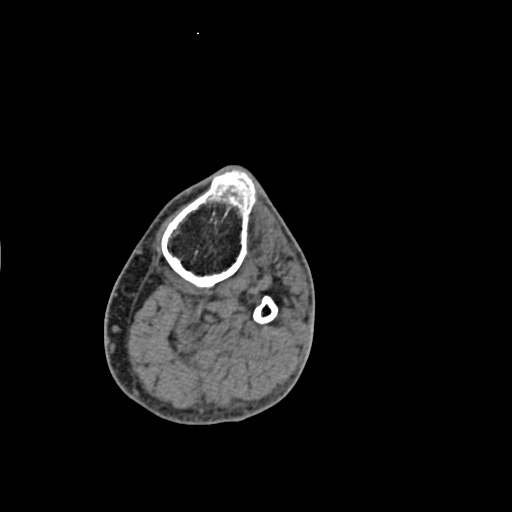
[im 14/96  bone]
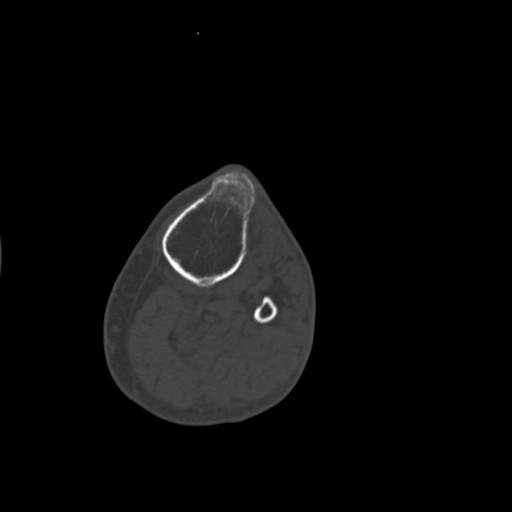
[im 28/96  bone]
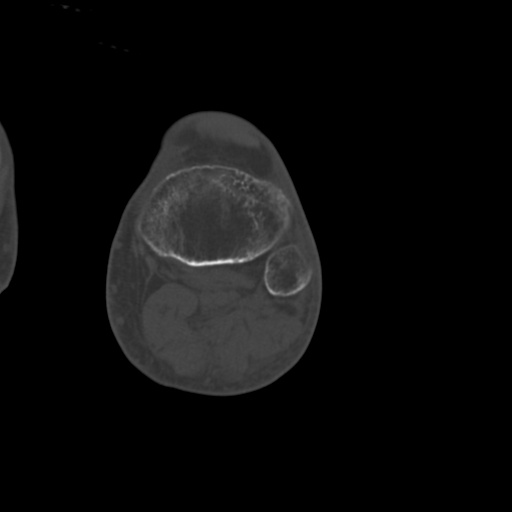
[im 41/96  bone]
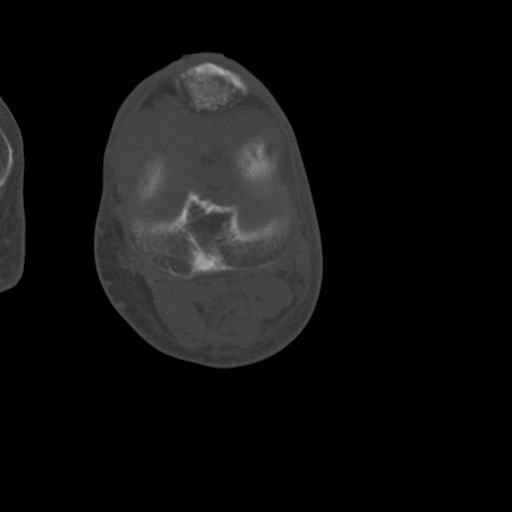
[im 55/96  bone]
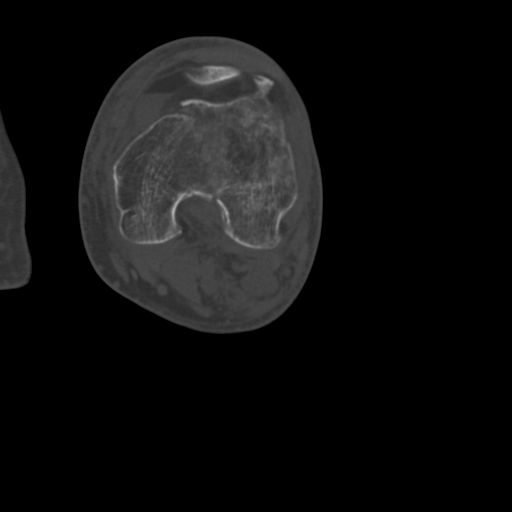
[im 68/96  soft-tissue]
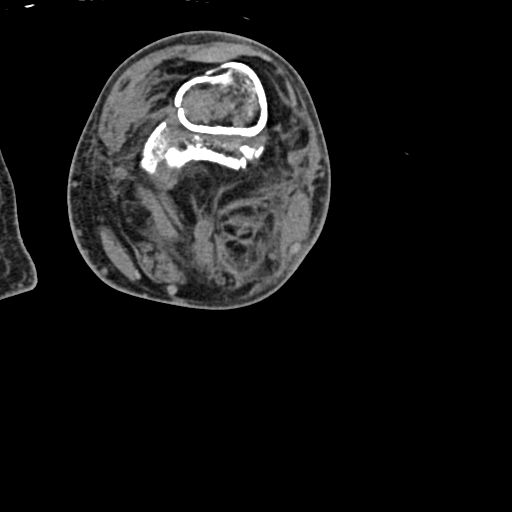
[im 68/96  bone]
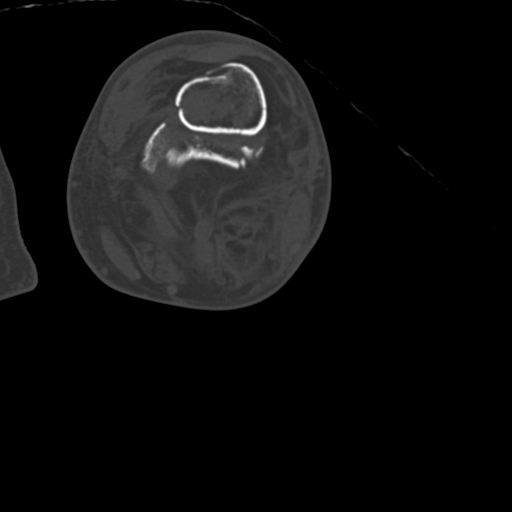
[im 82/96  bone]
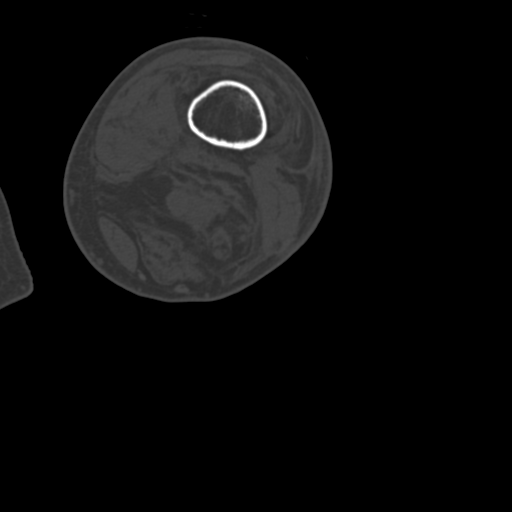

[Series 602: sag · sagittal · 0.49mm/px · 5 of 56 slices shown, 6 images]
[im 19/56  bone]
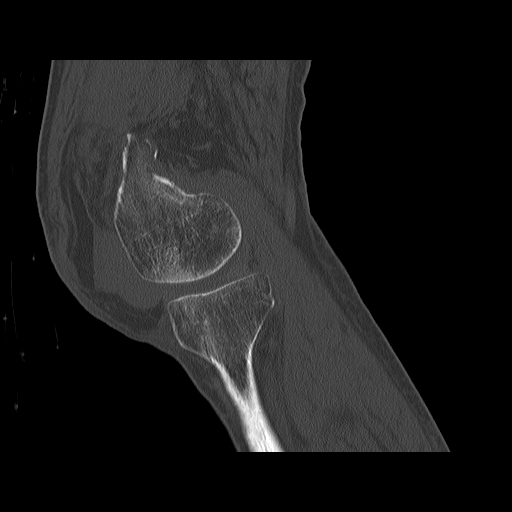
[im 23/56  bone]
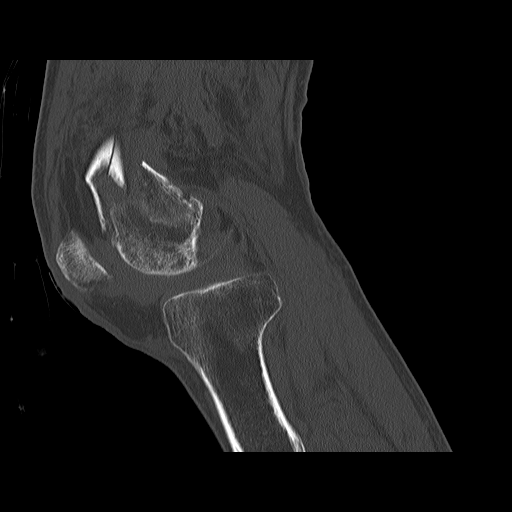
[im 28/56  soft-tissue]
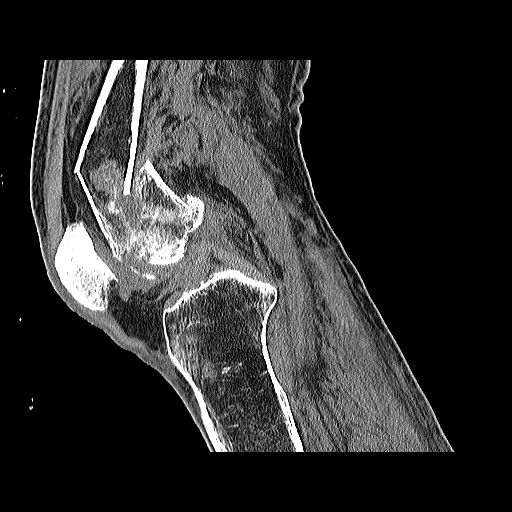
[im 28/56  bone]
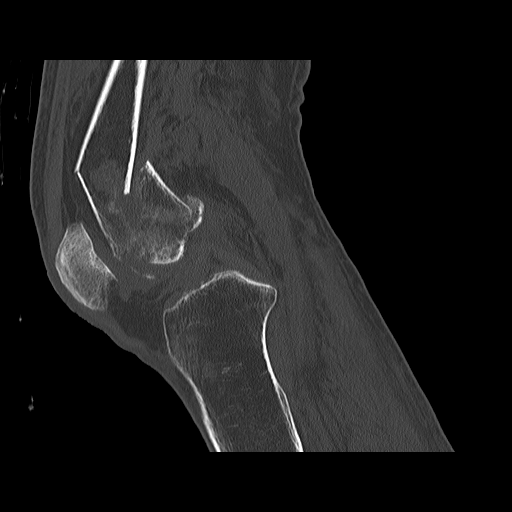
[im 33/56  bone]
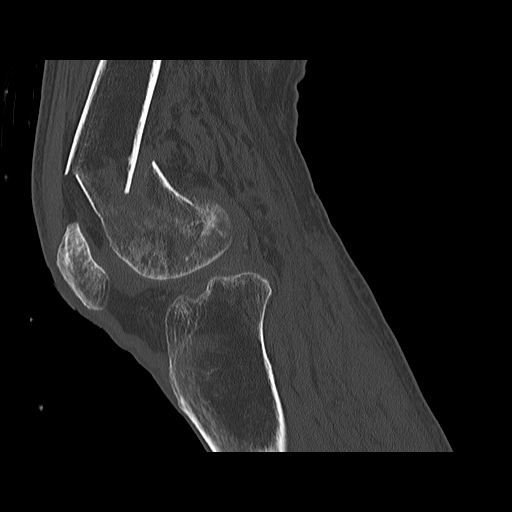
[im 37/56  bone]
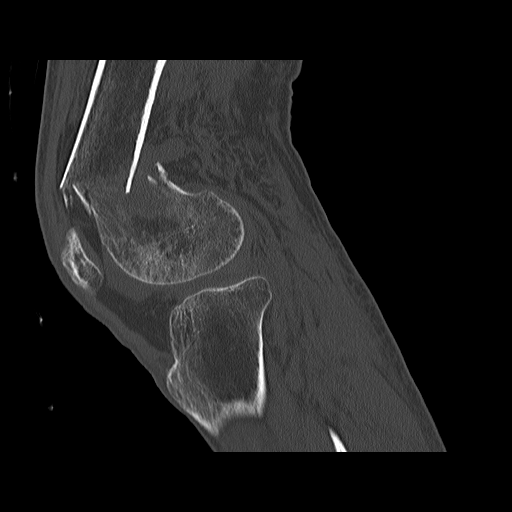

[Series 603: angled · axial · 0.49mm/px · z∈[-287,-258]mm · 2 of 76 slices shown]
[im 16/76  bone]
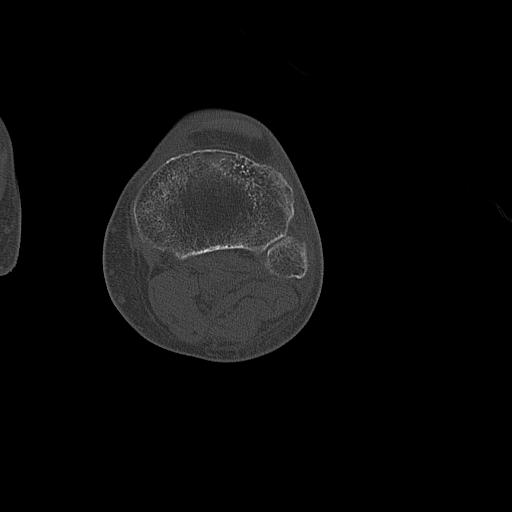
[im 31/76  bone]
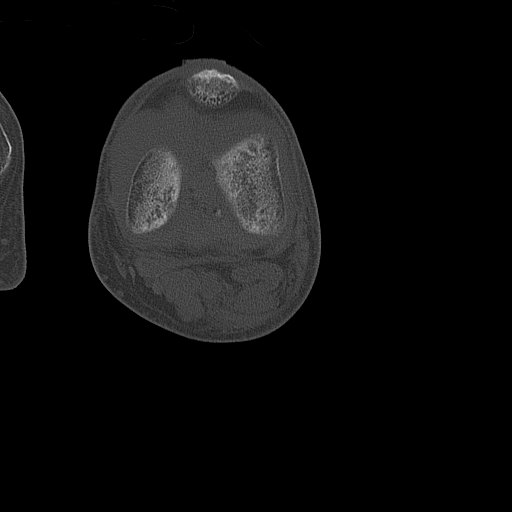

[Series 604: cor · coronal · 0.49mm/px · 3 of 58 slices shown]
[im 12/58  bone]
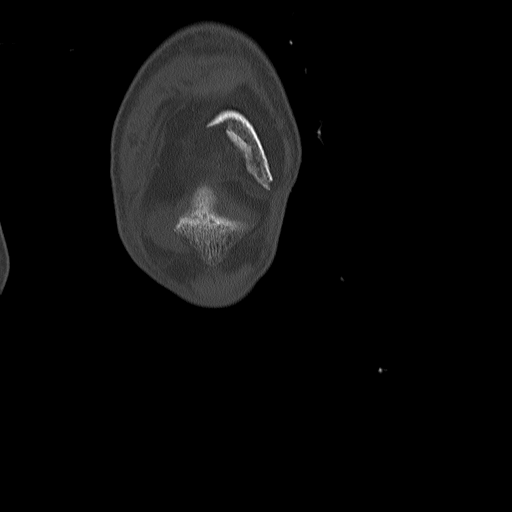
[im 23/58  bone]
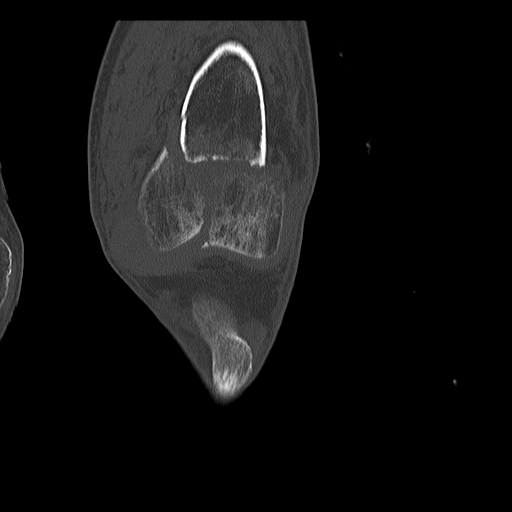
[im 35/58  bone]
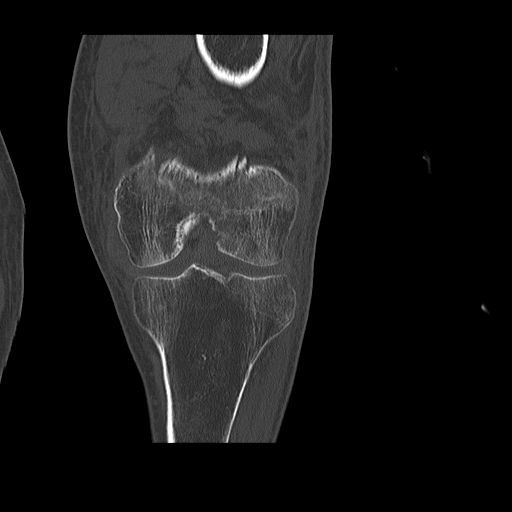

[16 of 33 positions shown; findings below may reference images not displayed]

FINDINGS: Bones: The bones are demineralized. There is a comminuted
intra-articular fracture of the distal femur. This has a comminuted
transverse component within the metaphysis which demonstrates
significant apex anterior angulation (approximately 54 degrees).
This component of the fracture is posteriorly displaced by up to 8
mm. There is distal intercondylar extension of the fracture with 12
mm of displacement of the articular surface of medial trochlea. The
weight-bearing articular surface of the femoral condyles is spared.
The proximal tibia, proximal fibula and patella are intact.

Joint/cartilage: Large lipohemarthrosis. Minimal underlying
degenerative changes for age.

Ligaments: Not applicable for exam/indication. The cruciate
ligaments appear grossly intact.

Tendons/muscles: Unremarkable.  The extensor mechanism is intact.

Neurovascular/other soft tissues: There is soft tissue edema
extending superiorly from the popliteal fossa into the distal thigh,
likely from capsular extravasation. No evidence of neurovascular
injury on noncontrast imaging.
IMPRESSION: 1. Comminuted, angulated and displaced fracture of the distal femur
as described. This fracture demonstrates distal intercondylar
extension, but no involvement of the weight-bearing articular
surface of the femoral condyles.
2. The proximal tibia, proximal fibula and patella are intact.
3. Large lipohemarthrosis with probable capsular extravasation into
the superior aspect of the popliteal fossa.

## 2022-02-24 DIAGNOSIS — R509 Fever, unspecified: Secondary | ICD-10-CM | POA: Diagnosis not present

## 2022-02-24 DIAGNOSIS — K469 Unspecified abdominal hernia without obstruction or gangrene: Secondary | ICD-10-CM | POA: Diagnosis not present

## 2022-03-02 DIAGNOSIS — M6281 Muscle weakness (generalized): Secondary | ICD-10-CM | POA: Diagnosis not present

## 2022-03-02 DIAGNOSIS — R2681 Unsteadiness on feet: Secondary | ICD-10-CM | POA: Diagnosis not present

## 2022-03-02 DIAGNOSIS — D649 Anemia, unspecified: Secondary | ICD-10-CM | POA: Diagnosis not present

## 2022-03-02 DIAGNOSIS — I10 Essential (primary) hypertension: Secondary | ICD-10-CM | POA: Diagnosis not present

## 2022-03-02 DIAGNOSIS — J9601 Acute respiratory failure with hypoxia: Secondary | ICD-10-CM | POA: Diagnosis not present

## 2022-03-02 DIAGNOSIS — D696 Thrombocytopenia, unspecified: Secondary | ICD-10-CM | POA: Diagnosis not present

## 2022-03-02 DIAGNOSIS — R262 Difficulty in walking, not elsewhere classified: Secondary | ICD-10-CM | POA: Diagnosis not present

## 2022-03-02 DIAGNOSIS — J189 Pneumonia, unspecified organism: Secondary | ICD-10-CM | POA: Diagnosis not present

## 2022-03-02 DIAGNOSIS — I739 Peripheral vascular disease, unspecified: Secondary | ICD-10-CM | POA: Diagnosis not present

## 2022-03-02 DIAGNOSIS — E119 Type 2 diabetes mellitus without complications: Secondary | ICD-10-CM | POA: Diagnosis not present

## 2022-03-02 DIAGNOSIS — R609 Edema, unspecified: Secondary | ICD-10-CM | POA: Diagnosis not present

## 2022-03-02 DIAGNOSIS — E559 Vitamin D deficiency, unspecified: Secondary | ICD-10-CM | POA: Diagnosis not present

## 2022-03-02 DIAGNOSIS — R269 Unspecified abnormalities of gait and mobility: Secondary | ICD-10-CM | POA: Diagnosis not present

## 2022-03-02 DIAGNOSIS — R230 Cyanosis: Secondary | ICD-10-CM | POA: Diagnosis not present

## 2022-03-02 DIAGNOSIS — Z741 Need for assistance with personal care: Secondary | ICD-10-CM | POA: Diagnosis not present

## 2022-03-02 DIAGNOSIS — A419 Sepsis, unspecified organism: Secondary | ICD-10-CM | POA: Diagnosis not present

## 2022-03-02 DIAGNOSIS — G9340 Encephalopathy, unspecified: Secondary | ICD-10-CM | POA: Diagnosis not present

## 2022-03-02 DIAGNOSIS — M2681 Anterior soft tissue impingement: Secondary | ICD-10-CM | POA: Diagnosis not present

## 2022-03-02 DIAGNOSIS — R41841 Cognitive communication deficit: Secondary | ICD-10-CM | POA: Diagnosis not present

## 2022-03-02 DIAGNOSIS — K219 Gastro-esophageal reflux disease without esophagitis: Secondary | ICD-10-CM | POA: Diagnosis not present

## 2022-03-02 DIAGNOSIS — R600 Localized edema: Secondary | ICD-10-CM | POA: Diagnosis not present

## 2022-03-03 DIAGNOSIS — D649 Anemia, unspecified: Secondary | ICD-10-CM | POA: Diagnosis not present

## 2022-03-03 DIAGNOSIS — K219 Gastro-esophageal reflux disease without esophagitis: Secondary | ICD-10-CM | POA: Diagnosis not present

## 2022-03-03 DIAGNOSIS — R230 Cyanosis: Secondary | ICD-10-CM | POA: Diagnosis not present

## 2022-03-03 DIAGNOSIS — R269 Unspecified abnormalities of gait and mobility: Secondary | ICD-10-CM | POA: Diagnosis not present

## 2022-03-03 DIAGNOSIS — I739 Peripheral vascular disease, unspecified: Secondary | ICD-10-CM | POA: Diagnosis not present

## 2022-03-03 DIAGNOSIS — D696 Thrombocytopenia, unspecified: Secondary | ICD-10-CM | POA: Diagnosis not present

## 2022-03-10 DIAGNOSIS — I739 Peripheral vascular disease, unspecified: Secondary | ICD-10-CM | POA: Diagnosis not present

## 2022-03-10 DIAGNOSIS — K219 Gastro-esophageal reflux disease without esophagitis: Secondary | ICD-10-CM | POA: Diagnosis not present

## 2022-03-10 DIAGNOSIS — D696 Thrombocytopenia, unspecified: Secondary | ICD-10-CM | POA: Diagnosis not present

## 2022-03-10 DIAGNOSIS — R230 Cyanosis: Secondary | ICD-10-CM | POA: Diagnosis not present

## 2022-03-18 DIAGNOSIS — R600 Localized edema: Secondary | ICD-10-CM | POA: Diagnosis not present

## 2022-03-22 DIAGNOSIS — I739 Peripheral vascular disease, unspecified: Secondary | ICD-10-CM | POA: Diagnosis not present

## 2022-03-22 DIAGNOSIS — R609 Edema, unspecified: Secondary | ICD-10-CM | POA: Diagnosis not present

## 2022-03-23 DIAGNOSIS — R2681 Unsteadiness on feet: Secondary | ICD-10-CM | POA: Diagnosis not present

## 2022-03-23 DIAGNOSIS — G9340 Encephalopathy, unspecified: Secondary | ICD-10-CM | POA: Diagnosis not present

## 2022-03-23 DIAGNOSIS — J189 Pneumonia, unspecified organism: Secondary | ICD-10-CM | POA: Diagnosis not present

## 2022-03-23 DIAGNOSIS — J9601 Acute respiratory failure with hypoxia: Secondary | ICD-10-CM | POA: Diagnosis not present

## 2022-04-01 DIAGNOSIS — I10 Essential (primary) hypertension: Secondary | ICD-10-CM | POA: Diagnosis not present

## 2022-04-21 DIAGNOSIS — M6281 Muscle weakness (generalized): Secondary | ICD-10-CM | POA: Diagnosis not present

## 2022-04-21 DIAGNOSIS — G9340 Encephalopathy, unspecified: Secondary | ICD-10-CM | POA: Diagnosis not present

## 2022-04-21 DIAGNOSIS — M2681 Anterior soft tissue impingement: Secondary | ICD-10-CM | POA: Diagnosis not present

## 2022-04-21 DIAGNOSIS — J9601 Acute respiratory failure with hypoxia: Secondary | ICD-10-CM | POA: Diagnosis not present

## 2022-04-21 DIAGNOSIS — J189 Pneumonia, unspecified organism: Secondary | ICD-10-CM | POA: Diagnosis not present

## 2022-04-21 DIAGNOSIS — R2681 Unsteadiness on feet: Secondary | ICD-10-CM | POA: Diagnosis not present

## 2022-04-21 DIAGNOSIS — Z741 Need for assistance with personal care: Secondary | ICD-10-CM | POA: Diagnosis not present

## 2022-04-21 DIAGNOSIS — R41841 Cognitive communication deficit: Secondary | ICD-10-CM | POA: Diagnosis not present

## 2022-04-21 DIAGNOSIS — R262 Difficulty in walking, not elsewhere classified: Secondary | ICD-10-CM | POA: Diagnosis not present

## 2022-04-22 DIAGNOSIS — M6281 Muscle weakness (generalized): Secondary | ICD-10-CM | POA: Diagnosis not present

## 2022-04-22 DIAGNOSIS — Z741 Need for assistance with personal care: Secondary | ICD-10-CM | POA: Diagnosis not present

## 2022-04-22 DIAGNOSIS — R262 Difficulty in walking, not elsewhere classified: Secondary | ICD-10-CM | POA: Diagnosis not present

## 2022-04-22 DIAGNOSIS — J9601 Acute respiratory failure with hypoxia: Secondary | ICD-10-CM | POA: Diagnosis not present

## 2022-04-22 DIAGNOSIS — R2681 Unsteadiness on feet: Secondary | ICD-10-CM | POA: Diagnosis not present

## 2022-04-22 DIAGNOSIS — M2681 Anterior soft tissue impingement: Secondary | ICD-10-CM | POA: Diagnosis not present

## 2022-04-22 DIAGNOSIS — J189 Pneumonia, unspecified organism: Secondary | ICD-10-CM | POA: Diagnosis not present

## 2022-04-22 DIAGNOSIS — G9340 Encephalopathy, unspecified: Secondary | ICD-10-CM | POA: Diagnosis not present

## 2022-04-22 DIAGNOSIS — R41841 Cognitive communication deficit: Secondary | ICD-10-CM | POA: Diagnosis not present

## 2022-04-23 DIAGNOSIS — M2681 Anterior soft tissue impingement: Secondary | ICD-10-CM | POA: Diagnosis not present

## 2022-04-23 DIAGNOSIS — J189 Pneumonia, unspecified organism: Secondary | ICD-10-CM | POA: Diagnosis not present

## 2022-04-23 DIAGNOSIS — Z741 Need for assistance with personal care: Secondary | ICD-10-CM | POA: Diagnosis not present

## 2022-04-23 DIAGNOSIS — J9601 Acute respiratory failure with hypoxia: Secondary | ICD-10-CM | POA: Diagnosis not present

## 2022-04-23 DIAGNOSIS — R262 Difficulty in walking, not elsewhere classified: Secondary | ICD-10-CM | POA: Diagnosis not present

## 2022-04-23 DIAGNOSIS — R41841 Cognitive communication deficit: Secondary | ICD-10-CM | POA: Diagnosis not present

## 2022-04-23 DIAGNOSIS — M6281 Muscle weakness (generalized): Secondary | ICD-10-CM | POA: Diagnosis not present

## 2022-04-23 DIAGNOSIS — G9340 Encephalopathy, unspecified: Secondary | ICD-10-CM | POA: Diagnosis not present

## 2022-04-23 DIAGNOSIS — R2681 Unsteadiness on feet: Secondary | ICD-10-CM | POA: Diagnosis not present

## 2022-04-24 DIAGNOSIS — J9601 Acute respiratory failure with hypoxia: Secondary | ICD-10-CM | POA: Diagnosis not present

## 2022-04-24 DIAGNOSIS — R262 Difficulty in walking, not elsewhere classified: Secondary | ICD-10-CM | POA: Diagnosis not present

## 2022-04-24 DIAGNOSIS — M6281 Muscle weakness (generalized): Secondary | ICD-10-CM | POA: Diagnosis not present

## 2022-04-24 DIAGNOSIS — M2681 Anterior soft tissue impingement: Secondary | ICD-10-CM | POA: Diagnosis not present

## 2022-04-24 DIAGNOSIS — G9340 Encephalopathy, unspecified: Secondary | ICD-10-CM | POA: Diagnosis not present

## 2022-04-24 DIAGNOSIS — Z741 Need for assistance with personal care: Secondary | ICD-10-CM | POA: Diagnosis not present

## 2022-04-24 DIAGNOSIS — R41841 Cognitive communication deficit: Secondary | ICD-10-CM | POA: Diagnosis not present

## 2022-04-24 DIAGNOSIS — J189 Pneumonia, unspecified organism: Secondary | ICD-10-CM | POA: Diagnosis not present

## 2022-04-24 DIAGNOSIS — R2681 Unsteadiness on feet: Secondary | ICD-10-CM | POA: Diagnosis not present

## 2022-04-25 DIAGNOSIS — J189 Pneumonia, unspecified organism: Secondary | ICD-10-CM | POA: Diagnosis not present

## 2022-04-25 DIAGNOSIS — M2681 Anterior soft tissue impingement: Secondary | ICD-10-CM | POA: Diagnosis not present

## 2022-04-25 DIAGNOSIS — R41841 Cognitive communication deficit: Secondary | ICD-10-CM | POA: Diagnosis not present

## 2022-04-25 DIAGNOSIS — R262 Difficulty in walking, not elsewhere classified: Secondary | ICD-10-CM | POA: Diagnosis not present

## 2022-04-25 DIAGNOSIS — M6281 Muscle weakness (generalized): Secondary | ICD-10-CM | POA: Diagnosis not present

## 2022-04-25 DIAGNOSIS — R2681 Unsteadiness on feet: Secondary | ICD-10-CM | POA: Diagnosis not present

## 2022-04-25 DIAGNOSIS — G9340 Encephalopathy, unspecified: Secondary | ICD-10-CM | POA: Diagnosis not present

## 2022-04-25 DIAGNOSIS — Z741 Need for assistance with personal care: Secondary | ICD-10-CM | POA: Diagnosis not present

## 2022-04-25 DIAGNOSIS — J9601 Acute respiratory failure with hypoxia: Secondary | ICD-10-CM | POA: Diagnosis not present

## 2022-04-26 DIAGNOSIS — R262 Difficulty in walking, not elsewhere classified: Secondary | ICD-10-CM | POA: Diagnosis not present

## 2022-04-26 DIAGNOSIS — R41841 Cognitive communication deficit: Secondary | ICD-10-CM | POA: Diagnosis not present

## 2022-04-26 DIAGNOSIS — J189 Pneumonia, unspecified organism: Secondary | ICD-10-CM | POA: Diagnosis not present

## 2022-04-26 DIAGNOSIS — G9340 Encephalopathy, unspecified: Secondary | ICD-10-CM | POA: Diagnosis not present

## 2022-04-26 DIAGNOSIS — R2681 Unsteadiness on feet: Secondary | ICD-10-CM | POA: Diagnosis not present

## 2022-04-26 DIAGNOSIS — M6281 Muscle weakness (generalized): Secondary | ICD-10-CM | POA: Diagnosis not present

## 2022-04-26 DIAGNOSIS — Z741 Need for assistance with personal care: Secondary | ICD-10-CM | POA: Diagnosis not present

## 2022-04-26 DIAGNOSIS — M2681 Anterior soft tissue impingement: Secondary | ICD-10-CM | POA: Diagnosis not present

## 2022-04-26 DIAGNOSIS — J9601 Acute respiratory failure with hypoxia: Secondary | ICD-10-CM | POA: Diagnosis not present

## 2022-04-27 DIAGNOSIS — R2681 Unsteadiness on feet: Secondary | ICD-10-CM | POA: Diagnosis not present

## 2022-04-27 DIAGNOSIS — Z741 Need for assistance with personal care: Secondary | ICD-10-CM | POA: Diagnosis not present

## 2022-04-27 DIAGNOSIS — J189 Pneumonia, unspecified organism: Secondary | ICD-10-CM | POA: Diagnosis not present

## 2022-04-27 DIAGNOSIS — G9340 Encephalopathy, unspecified: Secondary | ICD-10-CM | POA: Diagnosis not present

## 2022-04-27 DIAGNOSIS — R262 Difficulty in walking, not elsewhere classified: Secondary | ICD-10-CM | POA: Diagnosis not present

## 2022-04-27 DIAGNOSIS — M2681 Anterior soft tissue impingement: Secondary | ICD-10-CM | POA: Diagnosis not present

## 2022-04-27 DIAGNOSIS — R41841 Cognitive communication deficit: Secondary | ICD-10-CM | POA: Diagnosis not present

## 2022-04-27 DIAGNOSIS — J9601 Acute respiratory failure with hypoxia: Secondary | ICD-10-CM | POA: Diagnosis not present

## 2022-04-27 DIAGNOSIS — M6281 Muscle weakness (generalized): Secondary | ICD-10-CM | POA: Diagnosis not present

## 2022-04-28 DIAGNOSIS — R2681 Unsteadiness on feet: Secondary | ICD-10-CM | POA: Diagnosis not present

## 2022-04-28 DIAGNOSIS — Z741 Need for assistance with personal care: Secondary | ICD-10-CM | POA: Diagnosis not present

## 2022-04-28 DIAGNOSIS — M2681 Anterior soft tissue impingement: Secondary | ICD-10-CM | POA: Diagnosis not present

## 2022-04-28 DIAGNOSIS — J189 Pneumonia, unspecified organism: Secondary | ICD-10-CM | POA: Diagnosis not present

## 2022-04-28 DIAGNOSIS — M6281 Muscle weakness (generalized): Secondary | ICD-10-CM | POA: Diagnosis not present

## 2022-04-28 DIAGNOSIS — R41841 Cognitive communication deficit: Secondary | ICD-10-CM | POA: Diagnosis not present

## 2022-04-28 DIAGNOSIS — R262 Difficulty in walking, not elsewhere classified: Secondary | ICD-10-CM | POA: Diagnosis not present

## 2022-04-28 DIAGNOSIS — J9601 Acute respiratory failure with hypoxia: Secondary | ICD-10-CM | POA: Diagnosis not present

## 2022-04-28 DIAGNOSIS — G9340 Encephalopathy, unspecified: Secondary | ICD-10-CM | POA: Diagnosis not present

## 2022-04-29 DIAGNOSIS — K219 Gastro-esophageal reflux disease without esophagitis: Secondary | ICD-10-CM | POA: Diagnosis not present

## 2022-04-29 DIAGNOSIS — R2681 Unsteadiness on feet: Secondary | ICD-10-CM | POA: Diagnosis not present

## 2022-04-29 DIAGNOSIS — G9340 Encephalopathy, unspecified: Secondary | ICD-10-CM | POA: Diagnosis not present

## 2022-04-29 DIAGNOSIS — R262 Difficulty in walking, not elsewhere classified: Secondary | ICD-10-CM | POA: Diagnosis not present

## 2022-04-29 DIAGNOSIS — J189 Pneumonia, unspecified organism: Secondary | ICD-10-CM | POA: Diagnosis not present

## 2022-04-29 DIAGNOSIS — R41841 Cognitive communication deficit: Secondary | ICD-10-CM | POA: Diagnosis not present

## 2022-04-29 DIAGNOSIS — M6281 Muscle weakness (generalized): Secondary | ICD-10-CM | POA: Diagnosis not present

## 2022-04-29 DIAGNOSIS — M2681 Anterior soft tissue impingement: Secondary | ICD-10-CM | POA: Diagnosis not present

## 2022-04-29 DIAGNOSIS — J9601 Acute respiratory failure with hypoxia: Secondary | ICD-10-CM | POA: Diagnosis not present

## 2022-04-29 DIAGNOSIS — D649 Anemia, unspecified: Secondary | ICD-10-CM | POA: Diagnosis not present

## 2022-04-29 DIAGNOSIS — Z741 Need for assistance with personal care: Secondary | ICD-10-CM | POA: Diagnosis not present

## 2022-04-29 DIAGNOSIS — I739 Peripheral vascular disease, unspecified: Secondary | ICD-10-CM | POA: Diagnosis not present

## 2022-04-30 DIAGNOSIS — M2681 Anterior soft tissue impingement: Secondary | ICD-10-CM | POA: Diagnosis not present

## 2022-04-30 DIAGNOSIS — Z741 Need for assistance with personal care: Secondary | ICD-10-CM | POA: Diagnosis not present

## 2022-04-30 DIAGNOSIS — R2681 Unsteadiness on feet: Secondary | ICD-10-CM | POA: Diagnosis not present

## 2022-04-30 DIAGNOSIS — J189 Pneumonia, unspecified organism: Secondary | ICD-10-CM | POA: Diagnosis not present

## 2022-04-30 DIAGNOSIS — M6281 Muscle weakness (generalized): Secondary | ICD-10-CM | POA: Diagnosis not present

## 2022-04-30 DIAGNOSIS — R41841 Cognitive communication deficit: Secondary | ICD-10-CM | POA: Diagnosis not present

## 2022-04-30 DIAGNOSIS — J9601 Acute respiratory failure with hypoxia: Secondary | ICD-10-CM | POA: Diagnosis not present

## 2022-04-30 DIAGNOSIS — R262 Difficulty in walking, not elsewhere classified: Secondary | ICD-10-CM | POA: Diagnosis not present

## 2022-04-30 DIAGNOSIS — G9340 Encephalopathy, unspecified: Secondary | ICD-10-CM | POA: Diagnosis not present

## 2022-05-02 DIAGNOSIS — I739 Peripheral vascular disease, unspecified: Secondary | ICD-10-CM | POA: Diagnosis not present

## 2022-05-02 DIAGNOSIS — M2681 Anterior soft tissue impingement: Secondary | ICD-10-CM | POA: Diagnosis not present

## 2022-05-02 DIAGNOSIS — G9340 Encephalopathy, unspecified: Secondary | ICD-10-CM | POA: Diagnosis not present

## 2022-05-02 DIAGNOSIS — M6281 Muscle weakness (generalized): Secondary | ICD-10-CM | POA: Diagnosis not present

## 2022-05-02 DIAGNOSIS — J189 Pneumonia, unspecified organism: Secondary | ICD-10-CM | POA: Diagnosis not present

## 2022-05-02 DIAGNOSIS — R262 Difficulty in walking, not elsewhere classified: Secondary | ICD-10-CM | POA: Diagnosis not present

## 2022-05-02 DIAGNOSIS — R41841 Cognitive communication deficit: Secondary | ICD-10-CM | POA: Diagnosis not present

## 2022-05-02 DIAGNOSIS — Z741 Need for assistance with personal care: Secondary | ICD-10-CM | POA: Diagnosis not present

## 2022-05-02 DIAGNOSIS — J9601 Acute respiratory failure with hypoxia: Secondary | ICD-10-CM | POA: Diagnosis not present

## 2022-05-02 DIAGNOSIS — R2681 Unsteadiness on feet: Secondary | ICD-10-CM | POA: Diagnosis not present

## 2022-05-02 DIAGNOSIS — A419 Sepsis, unspecified organism: Secondary | ICD-10-CM | POA: Diagnosis not present

## 2022-05-03 DIAGNOSIS — G9340 Encephalopathy, unspecified: Secondary | ICD-10-CM | POA: Diagnosis not present

## 2022-05-03 DIAGNOSIS — M6281 Muscle weakness (generalized): Secondary | ICD-10-CM | POA: Diagnosis not present

## 2022-05-03 DIAGNOSIS — Z741 Need for assistance with personal care: Secondary | ICD-10-CM | POA: Diagnosis not present

## 2022-05-03 DIAGNOSIS — J9601 Acute respiratory failure with hypoxia: Secondary | ICD-10-CM | POA: Diagnosis not present

## 2022-05-03 DIAGNOSIS — J189 Pneumonia, unspecified organism: Secondary | ICD-10-CM | POA: Diagnosis not present

## 2022-05-03 DIAGNOSIS — M2681 Anterior soft tissue impingement: Secondary | ICD-10-CM | POA: Diagnosis not present

## 2022-05-03 DIAGNOSIS — R2681 Unsteadiness on feet: Secondary | ICD-10-CM | POA: Diagnosis not present

## 2022-05-03 DIAGNOSIS — R262 Difficulty in walking, not elsewhere classified: Secondary | ICD-10-CM | POA: Diagnosis not present

## 2022-05-03 DIAGNOSIS — R41841 Cognitive communication deficit: Secondary | ICD-10-CM | POA: Diagnosis not present

## 2022-05-04 DIAGNOSIS — R41841 Cognitive communication deficit: Secondary | ICD-10-CM | POA: Diagnosis not present

## 2022-05-04 DIAGNOSIS — M25561 Pain in right knee: Secondary | ICD-10-CM | POA: Diagnosis not present

## 2022-05-04 DIAGNOSIS — Z741 Need for assistance with personal care: Secondary | ICD-10-CM | POA: Diagnosis not present

## 2022-05-04 DIAGNOSIS — J9601 Acute respiratory failure with hypoxia: Secondary | ICD-10-CM | POA: Diagnosis not present

## 2022-05-10 DIAGNOSIS — R197 Diarrhea, unspecified: Secondary | ICD-10-CM | POA: Diagnosis not present

## 2022-05-11 DIAGNOSIS — Z741 Need for assistance with personal care: Secondary | ICD-10-CM | POA: Diagnosis not present

## 2022-05-11 DIAGNOSIS — R41841 Cognitive communication deficit: Secondary | ICD-10-CM | POA: Diagnosis not present

## 2022-05-11 DIAGNOSIS — M6281 Muscle weakness (generalized): Secondary | ICD-10-CM | POA: Diagnosis not present

## 2022-05-11 DIAGNOSIS — R197 Diarrhea, unspecified: Secondary | ICD-10-CM | POA: Diagnosis not present

## 2022-05-18 DIAGNOSIS — R140 Abdominal distension (gaseous): Secondary | ICD-10-CM | POA: Diagnosis not present

## 2022-05-20 DIAGNOSIS — N200 Calculus of kidney: Secondary | ICD-10-CM | POA: Diagnosis not present

## 2022-05-20 DIAGNOSIS — R935 Abnormal findings on diagnostic imaging of other abdominal regions, including retroperitoneum: Secondary | ICD-10-CM | POA: Diagnosis not present

## 2022-05-23 DIAGNOSIS — K567 Ileus, unspecified: Secondary | ICD-10-CM | POA: Diagnosis not present

## 2022-05-25 DIAGNOSIS — I10 Essential (primary) hypertension: Secondary | ICD-10-CM | POA: Diagnosis not present

## 2022-06-10 DIAGNOSIS — R197 Diarrhea, unspecified: Secondary | ICD-10-CM | POA: Diagnosis not present

## 2022-06-14 DIAGNOSIS — R197 Diarrhea, unspecified: Secondary | ICD-10-CM | POA: Diagnosis not present

## 2022-06-17 DIAGNOSIS — R197 Diarrhea, unspecified: Secondary | ICD-10-CM | POA: Diagnosis not present

## 2022-06-17 DIAGNOSIS — R109 Unspecified abdominal pain: Secondary | ICD-10-CM | POA: Diagnosis not present

## 2022-06-18 DIAGNOSIS — K567 Ileus, unspecified: Secondary | ICD-10-CM | POA: Diagnosis not present

## 2022-06-20 DIAGNOSIS — U071 COVID-19: Secondary | ICD-10-CM | POA: Diagnosis not present

## 2022-06-20 DIAGNOSIS — K567 Ileus, unspecified: Secondary | ICD-10-CM | POA: Diagnosis not present

## 2022-06-20 DIAGNOSIS — R609 Edema, unspecified: Secondary | ICD-10-CM | POA: Diagnosis not present

## 2022-06-20 DIAGNOSIS — R059 Cough, unspecified: Secondary | ICD-10-CM | POA: Diagnosis not present

## 2022-06-21 DIAGNOSIS — R791 Abnormal coagulation profile: Secondary | ICD-10-CM | POA: Diagnosis not present

## 2022-06-21 DIAGNOSIS — U071 COVID-19: Secondary | ICD-10-CM | POA: Diagnosis not present

## 2022-06-21 DIAGNOSIS — Z136 Encounter for screening for cardiovascular disorders: Secondary | ICD-10-CM | POA: Diagnosis not present

## 2022-06-22 DIAGNOSIS — I10 Essential (primary) hypertension: Secondary | ICD-10-CM | POA: Diagnosis not present

## 2022-06-22 DIAGNOSIS — R41841 Cognitive communication deficit: Secondary | ICD-10-CM | POA: Diagnosis not present

## 2022-06-22 DIAGNOSIS — R791 Abnormal coagulation profile: Secondary | ICD-10-CM | POA: Diagnosis not present

## 2022-06-23 DIAGNOSIS — R319 Hematuria, unspecified: Secondary | ICD-10-CM | POA: Diagnosis not present

## 2022-06-23 DIAGNOSIS — N390 Urinary tract infection, site not specified: Secondary | ICD-10-CM | POA: Diagnosis not present

## 2022-06-23 DIAGNOSIS — I10 Essential (primary) hypertension: Secondary | ICD-10-CM | POA: Diagnosis not present

## 2022-06-23 DIAGNOSIS — D61818 Other pancytopenia: Secondary | ICD-10-CM | POA: Diagnosis not present

## 2022-06-24 DIAGNOSIS — Z743 Need for continuous supervision: Secondary | ICD-10-CM | POA: Diagnosis not present

## 2022-06-24 DIAGNOSIS — N390 Urinary tract infection, site not specified: Secondary | ICD-10-CM | POA: Diagnosis not present

## 2022-06-24 DIAGNOSIS — R319 Hematuria, unspecified: Secondary | ICD-10-CM | POA: Diagnosis not present

## 2022-07-08 DIAGNOSIS — R197 Diarrhea, unspecified: Secondary | ICD-10-CM | POA: Diagnosis not present

## 2022-07-08 DIAGNOSIS — L853 Xerosis cutis: Secondary | ICD-10-CM | POA: Diagnosis not present

## 2022-07-08 DIAGNOSIS — K580 Irritable bowel syndrome with diarrhea: Secondary | ICD-10-CM | POA: Diagnosis not present

## 2022-07-08 DIAGNOSIS — R609 Edema, unspecified: Secondary | ICD-10-CM | POA: Diagnosis not present

## 2022-07-12 DIAGNOSIS — K589 Irritable bowel syndrome without diarrhea: Secondary | ICD-10-CM | POA: Diagnosis not present

## 2022-07-12 DIAGNOSIS — R197 Diarrhea, unspecified: Secondary | ICD-10-CM | POA: Diagnosis not present

## 2022-07-15 DIAGNOSIS — R197 Diarrhea, unspecified: Secondary | ICD-10-CM | POA: Diagnosis not present

## 2022-07-15 DIAGNOSIS — K589 Irritable bowel syndrome without diarrhea: Secondary | ICD-10-CM | POA: Diagnosis not present

## 2022-08-02 DIAGNOSIS — Z741 Need for assistance with personal care: Secondary | ICD-10-CM | POA: Diagnosis not present

## 2022-08-02 DIAGNOSIS — R262 Difficulty in walking, not elsewhere classified: Secondary | ICD-10-CM | POA: Diagnosis not present

## 2022-08-02 DIAGNOSIS — D696 Thrombocytopenia, unspecified: Secondary | ICD-10-CM | POA: Diagnosis not present

## 2022-08-02 DIAGNOSIS — I739 Peripheral vascular disease, unspecified: Secondary | ICD-10-CM | POA: Diagnosis not present

## 2022-08-02 DIAGNOSIS — J9601 Acute respiratory failure with hypoxia: Secondary | ICD-10-CM | POA: Diagnosis not present

## 2022-08-02 DIAGNOSIS — R2681 Unsteadiness on feet: Secondary | ICD-10-CM | POA: Diagnosis not present

## 2022-08-02 DIAGNOSIS — K219 Gastro-esophageal reflux disease without esophagitis: Secondary | ICD-10-CM | POA: Diagnosis not present

## 2022-08-02 DIAGNOSIS — R41841 Cognitive communication deficit: Secondary | ICD-10-CM | POA: Diagnosis not present

## 2022-08-02 DIAGNOSIS — M6281 Muscle weakness (generalized): Secondary | ICD-10-CM | POA: Diagnosis not present

## 2022-08-03 DIAGNOSIS — K219 Gastro-esophageal reflux disease without esophagitis: Secondary | ICD-10-CM | POA: Diagnosis not present

## 2022-08-03 DIAGNOSIS — R2681 Unsteadiness on feet: Secondary | ICD-10-CM | POA: Diagnosis not present

## 2022-08-03 DIAGNOSIS — M6281 Muscle weakness (generalized): Secondary | ICD-10-CM | POA: Diagnosis not present

## 2022-08-03 DIAGNOSIS — R41841 Cognitive communication deficit: Secondary | ICD-10-CM | POA: Diagnosis not present

## 2022-08-03 DIAGNOSIS — D696 Thrombocytopenia, unspecified: Secondary | ICD-10-CM | POA: Diagnosis not present

## 2022-08-03 DIAGNOSIS — I739 Peripheral vascular disease, unspecified: Secondary | ICD-10-CM | POA: Diagnosis not present

## 2022-08-03 DIAGNOSIS — R262 Difficulty in walking, not elsewhere classified: Secondary | ICD-10-CM | POA: Diagnosis not present

## 2022-08-03 DIAGNOSIS — J9601 Acute respiratory failure with hypoxia: Secondary | ICD-10-CM | POA: Diagnosis not present

## 2022-08-03 DIAGNOSIS — Z741 Need for assistance with personal care: Secondary | ICD-10-CM | POA: Diagnosis not present

## 2022-08-04 DIAGNOSIS — K219 Gastro-esophageal reflux disease without esophagitis: Secondary | ICD-10-CM | POA: Diagnosis not present

## 2022-08-04 DIAGNOSIS — Z741 Need for assistance with personal care: Secondary | ICD-10-CM | POA: Diagnosis not present

## 2022-08-04 DIAGNOSIS — R262 Difficulty in walking, not elsewhere classified: Secondary | ICD-10-CM | POA: Diagnosis not present

## 2022-08-04 DIAGNOSIS — R2681 Unsteadiness on feet: Secondary | ICD-10-CM | POA: Diagnosis not present

## 2022-08-04 DIAGNOSIS — D696 Thrombocytopenia, unspecified: Secondary | ICD-10-CM | POA: Diagnosis not present

## 2022-08-04 DIAGNOSIS — R41841 Cognitive communication deficit: Secondary | ICD-10-CM | POA: Diagnosis not present

## 2022-08-04 DIAGNOSIS — J9601 Acute respiratory failure with hypoxia: Secondary | ICD-10-CM | POA: Diagnosis not present

## 2022-08-04 DIAGNOSIS — I739 Peripheral vascular disease, unspecified: Secondary | ICD-10-CM | POA: Diagnosis not present

## 2022-08-04 DIAGNOSIS — M6281 Muscle weakness (generalized): Secondary | ICD-10-CM | POA: Diagnosis not present

## 2022-08-05 DIAGNOSIS — J9601 Acute respiratory failure with hypoxia: Secondary | ICD-10-CM | POA: Diagnosis not present

## 2022-08-05 DIAGNOSIS — R2681 Unsteadiness on feet: Secondary | ICD-10-CM | POA: Diagnosis not present

## 2022-08-05 DIAGNOSIS — R41841 Cognitive communication deficit: Secondary | ICD-10-CM | POA: Diagnosis not present

## 2022-08-05 DIAGNOSIS — L853 Xerosis cutis: Secondary | ICD-10-CM | POA: Diagnosis not present

## 2022-08-05 DIAGNOSIS — R600 Localized edema: Secondary | ICD-10-CM | POA: Diagnosis not present

## 2022-08-05 DIAGNOSIS — R262 Difficulty in walking, not elsewhere classified: Secondary | ICD-10-CM | POA: Diagnosis not present

## 2022-08-05 DIAGNOSIS — I10 Essential (primary) hypertension: Secondary | ICD-10-CM | POA: Diagnosis not present

## 2022-08-05 DIAGNOSIS — M6281 Muscle weakness (generalized): Secondary | ICD-10-CM | POA: Diagnosis not present

## 2022-08-05 DIAGNOSIS — Z741 Need for assistance with personal care: Secondary | ICD-10-CM | POA: Diagnosis not present

## 2022-08-08 DIAGNOSIS — R41841 Cognitive communication deficit: Secondary | ICD-10-CM | POA: Diagnosis not present

## 2022-08-08 DIAGNOSIS — I10 Essential (primary) hypertension: Secondary | ICD-10-CM | POA: Diagnosis not present

## 2022-08-08 DIAGNOSIS — R600 Localized edema: Secondary | ICD-10-CM | POA: Diagnosis not present

## 2022-08-08 DIAGNOSIS — R2681 Unsteadiness on feet: Secondary | ICD-10-CM | POA: Diagnosis not present

## 2022-08-08 DIAGNOSIS — J9601 Acute respiratory failure with hypoxia: Secondary | ICD-10-CM | POA: Diagnosis not present

## 2022-08-08 DIAGNOSIS — L853 Xerosis cutis: Secondary | ICD-10-CM | POA: Diagnosis not present

## 2022-08-08 DIAGNOSIS — Z741 Need for assistance with personal care: Secondary | ICD-10-CM | POA: Diagnosis not present

## 2022-08-08 DIAGNOSIS — M6281 Muscle weakness (generalized): Secondary | ICD-10-CM | POA: Diagnosis not present

## 2022-08-08 DIAGNOSIS — R262 Difficulty in walking, not elsewhere classified: Secondary | ICD-10-CM | POA: Diagnosis not present

## 2022-08-09 DIAGNOSIS — R2681 Unsteadiness on feet: Secondary | ICD-10-CM | POA: Diagnosis not present

## 2022-08-09 DIAGNOSIS — R41841 Cognitive communication deficit: Secondary | ICD-10-CM | POA: Diagnosis not present

## 2022-08-09 DIAGNOSIS — Z741 Need for assistance with personal care: Secondary | ICD-10-CM | POA: Diagnosis not present

## 2022-08-09 DIAGNOSIS — J9601 Acute respiratory failure with hypoxia: Secondary | ICD-10-CM | POA: Diagnosis not present

## 2022-08-09 DIAGNOSIS — R262 Difficulty in walking, not elsewhere classified: Secondary | ICD-10-CM | POA: Diagnosis not present

## 2022-08-09 DIAGNOSIS — R600 Localized edema: Secondary | ICD-10-CM | POA: Diagnosis not present

## 2022-08-09 DIAGNOSIS — L853 Xerosis cutis: Secondary | ICD-10-CM | POA: Diagnosis not present

## 2022-08-09 DIAGNOSIS — I10 Essential (primary) hypertension: Secondary | ICD-10-CM | POA: Diagnosis not present

## 2022-08-09 DIAGNOSIS — M6281 Muscle weakness (generalized): Secondary | ICD-10-CM | POA: Diagnosis not present

## 2022-08-10 DIAGNOSIS — M6281 Muscle weakness (generalized): Secondary | ICD-10-CM | POA: Diagnosis not present

## 2022-08-10 DIAGNOSIS — J9601 Acute respiratory failure with hypoxia: Secondary | ICD-10-CM | POA: Diagnosis not present

## 2022-08-10 DIAGNOSIS — Z741 Need for assistance with personal care: Secondary | ICD-10-CM | POA: Diagnosis not present

## 2022-08-10 DIAGNOSIS — R600 Localized edema: Secondary | ICD-10-CM | POA: Diagnosis not present

## 2022-08-10 DIAGNOSIS — R41841 Cognitive communication deficit: Secondary | ICD-10-CM | POA: Diagnosis not present

## 2022-08-10 DIAGNOSIS — R2681 Unsteadiness on feet: Secondary | ICD-10-CM | POA: Diagnosis not present

## 2022-08-10 DIAGNOSIS — I10 Essential (primary) hypertension: Secondary | ICD-10-CM | POA: Diagnosis not present

## 2022-08-10 DIAGNOSIS — L853 Xerosis cutis: Secondary | ICD-10-CM | POA: Diagnosis not present

## 2022-08-10 DIAGNOSIS — R262 Difficulty in walking, not elsewhere classified: Secondary | ICD-10-CM | POA: Diagnosis not present

## 2022-08-11 DIAGNOSIS — L853 Xerosis cutis: Secondary | ICD-10-CM | POA: Diagnosis not present

## 2022-08-11 DIAGNOSIS — I10 Essential (primary) hypertension: Secondary | ICD-10-CM | POA: Diagnosis not present

## 2022-08-11 DIAGNOSIS — M6281 Muscle weakness (generalized): Secondary | ICD-10-CM | POA: Diagnosis not present

## 2022-08-11 DIAGNOSIS — Z741 Need for assistance with personal care: Secondary | ICD-10-CM | POA: Diagnosis not present

## 2022-08-11 DIAGNOSIS — J9601 Acute respiratory failure with hypoxia: Secondary | ICD-10-CM | POA: Diagnosis not present

## 2022-08-11 DIAGNOSIS — R600 Localized edema: Secondary | ICD-10-CM | POA: Diagnosis not present

## 2022-08-11 DIAGNOSIS — R2681 Unsteadiness on feet: Secondary | ICD-10-CM | POA: Diagnosis not present

## 2022-08-11 DIAGNOSIS — R41841 Cognitive communication deficit: Secondary | ICD-10-CM | POA: Diagnosis not present

## 2022-08-11 DIAGNOSIS — R262 Difficulty in walking, not elsewhere classified: Secondary | ICD-10-CM | POA: Diagnosis not present

## 2022-08-12 DIAGNOSIS — K580 Irritable bowel syndrome with diarrhea: Secondary | ICD-10-CM | POA: Diagnosis not present

## 2022-08-22 DIAGNOSIS — R197 Diarrhea, unspecified: Secondary | ICD-10-CM | POA: Diagnosis not present

## 2022-09-01 DIAGNOSIS — J9601 Acute respiratory failure with hypoxia: Secondary | ICD-10-CM | POA: Diagnosis not present

## 2022-09-01 DIAGNOSIS — K580 Irritable bowel syndrome with diarrhea: Secondary | ICD-10-CM | POA: Diagnosis not present

## 2022-09-01 DIAGNOSIS — R41841 Cognitive communication deficit: Secondary | ICD-10-CM | POA: Diagnosis not present

## 2022-09-01 DIAGNOSIS — I10 Essential (primary) hypertension: Secondary | ICD-10-CM | POA: Diagnosis not present

## 2022-09-14 DIAGNOSIS — R197 Diarrhea, unspecified: Secondary | ICD-10-CM | POA: Diagnosis not present

## 2022-09-14 DIAGNOSIS — Z713 Dietary counseling and surveillance: Secondary | ICD-10-CM | POA: Diagnosis not present

## 2022-09-14 DIAGNOSIS — R41841 Cognitive communication deficit: Secondary | ICD-10-CM | POA: Diagnosis not present

## 2022-09-16 DIAGNOSIS — K219 Gastro-esophageal reflux disease without esophagitis: Secondary | ICD-10-CM | POA: Diagnosis not present

## 2022-09-16 DIAGNOSIS — I10 Essential (primary) hypertension: Secondary | ICD-10-CM | POA: Diagnosis not present

## 2022-09-16 DIAGNOSIS — I739 Peripheral vascular disease, unspecified: Secondary | ICD-10-CM | POA: Diagnosis not present

## 2022-09-16 DIAGNOSIS — J9601 Acute respiratory failure with hypoxia: Secondary | ICD-10-CM | POA: Diagnosis not present

## 2022-09-22 DIAGNOSIS — E876 Hypokalemia: Secondary | ICD-10-CM | POA: Diagnosis not present

## 2022-09-22 DIAGNOSIS — R600 Localized edema: Secondary | ICD-10-CM | POA: Diagnosis not present

## 2022-09-26 DIAGNOSIS — E559 Vitamin D deficiency, unspecified: Secondary | ICD-10-CM | POA: Diagnosis not present

## 2022-09-26 DIAGNOSIS — I10 Essential (primary) hypertension: Secondary | ICD-10-CM | POA: Diagnosis not present

## 2022-09-26 DIAGNOSIS — E119 Type 2 diabetes mellitus without complications: Secondary | ICD-10-CM | POA: Diagnosis not present

## 2022-10-05 DIAGNOSIS — I10 Essential (primary) hypertension: Secondary | ICD-10-CM | POA: Diagnosis not present

## 2022-10-05 DIAGNOSIS — Z741 Need for assistance with personal care: Secondary | ICD-10-CM | POA: Diagnosis not present

## 2022-10-05 DIAGNOSIS — J9601 Acute respiratory failure with hypoxia: Secondary | ICD-10-CM | POA: Diagnosis not present

## 2022-10-05 DIAGNOSIS — R41841 Cognitive communication deficit: Secondary | ICD-10-CM | POA: Diagnosis not present

## 2022-10-12 DIAGNOSIS — I10 Essential (primary) hypertension: Secondary | ICD-10-CM | POA: Diagnosis not present

## 2022-10-12 DIAGNOSIS — R41841 Cognitive communication deficit: Secondary | ICD-10-CM | POA: Diagnosis not present

## 2022-10-12 DIAGNOSIS — M6281 Muscle weakness (generalized): Secondary | ICD-10-CM | POA: Diagnosis not present

## 2022-10-12 DIAGNOSIS — J9600 Acute respiratory failure, unspecified whether with hypoxia or hypercapnia: Secondary | ICD-10-CM | POA: Diagnosis not present

## 2022-10-21 DIAGNOSIS — J9601 Acute respiratory failure with hypoxia: Secondary | ICD-10-CM | POA: Diagnosis not present

## 2022-10-21 DIAGNOSIS — I10 Essential (primary) hypertension: Secondary | ICD-10-CM | POA: Diagnosis not present

## 2022-10-21 DIAGNOSIS — I739 Peripheral vascular disease, unspecified: Secondary | ICD-10-CM | POA: Diagnosis not present

## 2022-10-21 DIAGNOSIS — K219 Gastro-esophageal reflux disease without esophagitis: Secondary | ICD-10-CM | POA: Diagnosis not present

## 2022-11-24 DIAGNOSIS — I10 Essential (primary) hypertension: Secondary | ICD-10-CM | POA: Diagnosis not present

## 2022-11-24 DIAGNOSIS — J9601 Acute respiratory failure with hypoxia: Secondary | ICD-10-CM | POA: Diagnosis not present

## 2022-11-24 DIAGNOSIS — I739 Peripheral vascular disease, unspecified: Secondary | ICD-10-CM | POA: Diagnosis not present

## 2022-11-24 DIAGNOSIS — K219 Gastro-esophageal reflux disease without esophagitis: Secondary | ICD-10-CM | POA: Diagnosis not present

## 2022-12-05 DIAGNOSIS — I739 Peripheral vascular disease, unspecified: Secondary | ICD-10-CM | POA: Diagnosis not present

## 2022-12-05 DIAGNOSIS — R600 Localized edema: Secondary | ICD-10-CM | POA: Diagnosis not present

## 2022-12-11 DIAGNOSIS — L853 Xerosis cutis: Secondary | ICD-10-CM | POA: Diagnosis not present

## 2022-12-11 DIAGNOSIS — J9601 Acute respiratory failure with hypoxia: Secondary | ICD-10-CM | POA: Diagnosis not present

## 2022-12-11 DIAGNOSIS — R262 Difficulty in walking, not elsewhere classified: Secondary | ICD-10-CM | POA: Diagnosis not present

## 2022-12-11 DIAGNOSIS — R2681 Unsteadiness on feet: Secondary | ICD-10-CM | POA: Diagnosis not present

## 2022-12-11 DIAGNOSIS — I10 Essential (primary) hypertension: Secondary | ICD-10-CM | POA: Diagnosis not present

## 2022-12-11 DIAGNOSIS — Z741 Need for assistance with personal care: Secondary | ICD-10-CM | POA: Diagnosis not present

## 2022-12-11 DIAGNOSIS — R600 Localized edema: Secondary | ICD-10-CM | POA: Diagnosis not present

## 2022-12-11 DIAGNOSIS — R41841 Cognitive communication deficit: Secondary | ICD-10-CM | POA: Diagnosis not present

## 2022-12-11 DIAGNOSIS — M6281 Muscle weakness (generalized): Secondary | ICD-10-CM | POA: Diagnosis not present

## 2022-12-12 DIAGNOSIS — R262 Difficulty in walking, not elsewhere classified: Secondary | ICD-10-CM | POA: Diagnosis not present

## 2022-12-12 DIAGNOSIS — M6281 Muscle weakness (generalized): Secondary | ICD-10-CM | POA: Diagnosis not present

## 2022-12-12 DIAGNOSIS — R600 Localized edema: Secondary | ICD-10-CM | POA: Diagnosis not present

## 2022-12-12 DIAGNOSIS — R2681 Unsteadiness on feet: Secondary | ICD-10-CM | POA: Diagnosis not present

## 2022-12-12 DIAGNOSIS — Z741 Need for assistance with personal care: Secondary | ICD-10-CM | POA: Diagnosis not present

## 2022-12-12 DIAGNOSIS — J9601 Acute respiratory failure with hypoxia: Secondary | ICD-10-CM | POA: Diagnosis not present

## 2022-12-12 DIAGNOSIS — L853 Xerosis cutis: Secondary | ICD-10-CM | POA: Diagnosis not present

## 2022-12-12 DIAGNOSIS — I10 Essential (primary) hypertension: Secondary | ICD-10-CM | POA: Diagnosis not present

## 2022-12-12 DIAGNOSIS — R41841 Cognitive communication deficit: Secondary | ICD-10-CM | POA: Diagnosis not present

## 2022-12-13 DIAGNOSIS — Z741 Need for assistance with personal care: Secondary | ICD-10-CM | POA: Diagnosis not present

## 2022-12-13 DIAGNOSIS — J9601 Acute respiratory failure with hypoxia: Secondary | ICD-10-CM | POA: Diagnosis not present

## 2022-12-13 DIAGNOSIS — R2681 Unsteadiness on feet: Secondary | ICD-10-CM | POA: Diagnosis not present

## 2022-12-13 DIAGNOSIS — R600 Localized edema: Secondary | ICD-10-CM | POA: Diagnosis not present

## 2022-12-13 DIAGNOSIS — I10 Essential (primary) hypertension: Secondary | ICD-10-CM | POA: Diagnosis not present

## 2022-12-13 DIAGNOSIS — M6281 Muscle weakness (generalized): Secondary | ICD-10-CM | POA: Diagnosis not present

## 2022-12-13 DIAGNOSIS — R262 Difficulty in walking, not elsewhere classified: Secondary | ICD-10-CM | POA: Diagnosis not present

## 2022-12-13 DIAGNOSIS — L853 Xerosis cutis: Secondary | ICD-10-CM | POA: Diagnosis not present

## 2022-12-13 DIAGNOSIS — R41841 Cognitive communication deficit: Secondary | ICD-10-CM | POA: Diagnosis not present

## 2022-12-14 DIAGNOSIS — R262 Difficulty in walking, not elsewhere classified: Secondary | ICD-10-CM | POA: Diagnosis not present

## 2022-12-14 DIAGNOSIS — J9601 Acute respiratory failure with hypoxia: Secondary | ICD-10-CM | POA: Diagnosis not present

## 2022-12-14 DIAGNOSIS — R2681 Unsteadiness on feet: Secondary | ICD-10-CM | POA: Diagnosis not present

## 2022-12-14 DIAGNOSIS — I739 Peripheral vascular disease, unspecified: Secondary | ICD-10-CM | POA: Diagnosis not present

## 2022-12-14 DIAGNOSIS — Z741 Need for assistance with personal care: Secondary | ICD-10-CM | POA: Diagnosis not present

## 2022-12-14 DIAGNOSIS — I10 Essential (primary) hypertension: Secondary | ICD-10-CM | POA: Diagnosis not present

## 2022-12-14 DIAGNOSIS — L853 Xerosis cutis: Secondary | ICD-10-CM | POA: Diagnosis not present

## 2022-12-14 DIAGNOSIS — R600 Localized edema: Secondary | ICD-10-CM | POA: Diagnosis not present

## 2022-12-14 DIAGNOSIS — R41841 Cognitive communication deficit: Secondary | ICD-10-CM | POA: Diagnosis not present

## 2022-12-14 DIAGNOSIS — M6281 Muscle weakness (generalized): Secondary | ICD-10-CM | POA: Diagnosis not present

## 2022-12-21 DIAGNOSIS — R2681 Unsteadiness on feet: Secondary | ICD-10-CM | POA: Diagnosis not present

## 2022-12-21 DIAGNOSIS — R41841 Cognitive communication deficit: Secondary | ICD-10-CM | POA: Diagnosis not present

## 2022-12-21 DIAGNOSIS — M6281 Muscle weakness (generalized): Secondary | ICD-10-CM | POA: Diagnosis not present

## 2022-12-21 DIAGNOSIS — I10 Essential (primary) hypertension: Secondary | ICD-10-CM | POA: Diagnosis not present

## 2022-12-21 DIAGNOSIS — R600 Localized edema: Secondary | ICD-10-CM | POA: Diagnosis not present

## 2022-12-21 DIAGNOSIS — L853 Xerosis cutis: Secondary | ICD-10-CM | POA: Diagnosis not present

## 2022-12-21 DIAGNOSIS — J9601 Acute respiratory failure with hypoxia: Secondary | ICD-10-CM | POA: Diagnosis not present

## 2022-12-21 DIAGNOSIS — Z741 Need for assistance with personal care: Secondary | ICD-10-CM | POA: Diagnosis not present

## 2022-12-21 DIAGNOSIS — R262 Difficulty in walking, not elsewhere classified: Secondary | ICD-10-CM | POA: Diagnosis not present

## 2022-12-23 DIAGNOSIS — R600 Localized edema: Secondary | ICD-10-CM | POA: Diagnosis not present

## 2022-12-23 DIAGNOSIS — R262 Difficulty in walking, not elsewhere classified: Secondary | ICD-10-CM | POA: Diagnosis not present

## 2022-12-23 DIAGNOSIS — I10 Essential (primary) hypertension: Secondary | ICD-10-CM | POA: Diagnosis not present

## 2022-12-23 DIAGNOSIS — L853 Xerosis cutis: Secondary | ICD-10-CM | POA: Diagnosis not present

## 2022-12-23 DIAGNOSIS — J9601 Acute respiratory failure with hypoxia: Secondary | ICD-10-CM | POA: Diagnosis not present

## 2022-12-23 DIAGNOSIS — R2681 Unsteadiness on feet: Secondary | ICD-10-CM | POA: Diagnosis not present

## 2022-12-23 DIAGNOSIS — R41841 Cognitive communication deficit: Secondary | ICD-10-CM | POA: Diagnosis not present

## 2022-12-23 DIAGNOSIS — M6281 Muscle weakness (generalized): Secondary | ICD-10-CM | POA: Diagnosis not present

## 2022-12-23 DIAGNOSIS — Z741 Need for assistance with personal care: Secondary | ICD-10-CM | POA: Diagnosis not present

## 2022-12-24 DIAGNOSIS — R2681 Unsteadiness on feet: Secondary | ICD-10-CM | POA: Diagnosis not present

## 2022-12-24 DIAGNOSIS — M6281 Muscle weakness (generalized): Secondary | ICD-10-CM | POA: Diagnosis not present

## 2022-12-24 DIAGNOSIS — R262 Difficulty in walking, not elsewhere classified: Secondary | ICD-10-CM | POA: Diagnosis not present

## 2022-12-24 DIAGNOSIS — R41841 Cognitive communication deficit: Secondary | ICD-10-CM | POA: Diagnosis not present

## 2022-12-24 DIAGNOSIS — Z741 Need for assistance with personal care: Secondary | ICD-10-CM | POA: Diagnosis not present

## 2022-12-24 DIAGNOSIS — L853 Xerosis cutis: Secondary | ICD-10-CM | POA: Diagnosis not present

## 2022-12-24 DIAGNOSIS — I10 Essential (primary) hypertension: Secondary | ICD-10-CM | POA: Diagnosis not present

## 2022-12-24 DIAGNOSIS — R600 Localized edema: Secondary | ICD-10-CM | POA: Diagnosis not present

## 2022-12-24 DIAGNOSIS — J9601 Acute respiratory failure with hypoxia: Secondary | ICD-10-CM | POA: Diagnosis not present

## 2022-12-26 DIAGNOSIS — I10 Essential (primary) hypertension: Secondary | ICD-10-CM | POA: Diagnosis not present

## 2022-12-26 DIAGNOSIS — J9601 Acute respiratory failure with hypoxia: Secondary | ICD-10-CM | POA: Diagnosis not present

## 2022-12-26 DIAGNOSIS — L853 Xerosis cutis: Secondary | ICD-10-CM | POA: Diagnosis not present

## 2022-12-26 DIAGNOSIS — R600 Localized edema: Secondary | ICD-10-CM | POA: Diagnosis not present

## 2022-12-26 DIAGNOSIS — R262 Difficulty in walking, not elsewhere classified: Secondary | ICD-10-CM | POA: Diagnosis not present

## 2022-12-26 DIAGNOSIS — H1045 Other chronic allergic conjunctivitis: Secondary | ICD-10-CM | POA: Diagnosis not present

## 2022-12-26 DIAGNOSIS — Z741 Need for assistance with personal care: Secondary | ICD-10-CM | POA: Diagnosis not present

## 2022-12-26 DIAGNOSIS — M6281 Muscle weakness (generalized): Secondary | ICD-10-CM | POA: Diagnosis not present

## 2022-12-26 DIAGNOSIS — R2681 Unsteadiness on feet: Secondary | ICD-10-CM | POA: Diagnosis not present

## 2022-12-26 DIAGNOSIS — R41841 Cognitive communication deficit: Secondary | ICD-10-CM | POA: Diagnosis not present

## 2022-12-27 DIAGNOSIS — J9601 Acute respiratory failure with hypoxia: Secondary | ICD-10-CM | POA: Diagnosis not present

## 2022-12-27 DIAGNOSIS — R2681 Unsteadiness on feet: Secondary | ICD-10-CM | POA: Diagnosis not present

## 2022-12-27 DIAGNOSIS — R600 Localized edema: Secondary | ICD-10-CM | POA: Diagnosis not present

## 2022-12-27 DIAGNOSIS — R262 Difficulty in walking, not elsewhere classified: Secondary | ICD-10-CM | POA: Diagnosis not present

## 2022-12-27 DIAGNOSIS — R41841 Cognitive communication deficit: Secondary | ICD-10-CM | POA: Diagnosis not present

## 2022-12-27 DIAGNOSIS — L853 Xerosis cutis: Secondary | ICD-10-CM | POA: Diagnosis not present

## 2022-12-27 DIAGNOSIS — M6281 Muscle weakness (generalized): Secondary | ICD-10-CM | POA: Diagnosis not present

## 2022-12-27 DIAGNOSIS — Z741 Need for assistance with personal care: Secondary | ICD-10-CM | POA: Diagnosis not present

## 2022-12-27 DIAGNOSIS — I10 Essential (primary) hypertension: Secondary | ICD-10-CM | POA: Diagnosis not present

## 2022-12-28 DIAGNOSIS — R262 Difficulty in walking, not elsewhere classified: Secondary | ICD-10-CM | POA: Diagnosis not present

## 2022-12-28 DIAGNOSIS — R600 Localized edema: Secondary | ICD-10-CM | POA: Diagnosis not present

## 2022-12-28 DIAGNOSIS — L853 Xerosis cutis: Secondary | ICD-10-CM | POA: Diagnosis not present

## 2022-12-28 DIAGNOSIS — Z741 Need for assistance with personal care: Secondary | ICD-10-CM | POA: Diagnosis not present

## 2022-12-28 DIAGNOSIS — I10 Essential (primary) hypertension: Secondary | ICD-10-CM | POA: Diagnosis not present

## 2022-12-28 DIAGNOSIS — R41841 Cognitive communication deficit: Secondary | ICD-10-CM | POA: Diagnosis not present

## 2022-12-28 DIAGNOSIS — R2681 Unsteadiness on feet: Secondary | ICD-10-CM | POA: Diagnosis not present

## 2022-12-28 DIAGNOSIS — M6281 Muscle weakness (generalized): Secondary | ICD-10-CM | POA: Diagnosis not present

## 2022-12-28 DIAGNOSIS — J9601 Acute respiratory failure with hypoxia: Secondary | ICD-10-CM | POA: Diagnosis not present

## 2023-01-03 DIAGNOSIS — H1045 Other chronic allergic conjunctivitis: Secondary | ICD-10-CM | POA: Diagnosis not present

## 2023-01-09 DIAGNOSIS — J9601 Acute respiratory failure with hypoxia: Secondary | ICD-10-CM | POA: Diagnosis not present

## 2023-01-09 DIAGNOSIS — I10 Essential (primary) hypertension: Secondary | ICD-10-CM | POA: Diagnosis not present

## 2023-01-09 DIAGNOSIS — K219 Gastro-esophageal reflux disease without esophagitis: Secondary | ICD-10-CM | POA: Diagnosis not present

## 2023-01-09 DIAGNOSIS — I739 Peripheral vascular disease, unspecified: Secondary | ICD-10-CM | POA: Diagnosis not present

## 2023-02-15 DIAGNOSIS — I10 Essential (primary) hypertension: Secondary | ICD-10-CM | POA: Diagnosis not present

## 2023-02-15 DIAGNOSIS — Z741 Need for assistance with personal care: Secondary | ICD-10-CM | POA: Diagnosis not present

## 2023-02-15 DIAGNOSIS — R41841 Cognitive communication deficit: Secondary | ICD-10-CM | POA: Diagnosis not present

## 2023-02-15 DIAGNOSIS — J9601 Acute respiratory failure with hypoxia: Secondary | ICD-10-CM | POA: Diagnosis not present

## 2023-03-21 DIAGNOSIS — I10 Essential (primary) hypertension: Secondary | ICD-10-CM | POA: Diagnosis not present

## 2023-03-21 DIAGNOSIS — I739 Peripheral vascular disease, unspecified: Secondary | ICD-10-CM | POA: Diagnosis not present

## 2023-03-21 DIAGNOSIS — K219 Gastro-esophageal reflux disease without esophagitis: Secondary | ICD-10-CM | POA: Diagnosis not present

## 2023-03-21 DIAGNOSIS — J9601 Acute respiratory failure with hypoxia: Secondary | ICD-10-CM | POA: Diagnosis not present

## 2023-03-30 DIAGNOSIS — L03119 Cellulitis of unspecified part of limb: Secondary | ICD-10-CM | POA: Diagnosis not present

## 2023-04-06 DIAGNOSIS — Z91148 Patient's other noncompliance with medication regimen for other reason: Secondary | ICD-10-CM | POA: Diagnosis not present

## 2023-04-06 DIAGNOSIS — R2243 Localized swelling, mass and lump, lower limb, bilateral: Secondary | ICD-10-CM | POA: Diagnosis not present

## 2023-04-14 DIAGNOSIS — R2243 Localized swelling, mass and lump, lower limb, bilateral: Secondary | ICD-10-CM | POA: Diagnosis not present

## 2023-04-17 DIAGNOSIS — G8929 Other chronic pain: Secondary | ICD-10-CM | POA: Diagnosis not present

## 2023-04-17 DIAGNOSIS — R2243 Localized swelling, mass and lump, lower limb, bilateral: Secondary | ICD-10-CM | POA: Diagnosis not present

## 2023-05-02 DIAGNOSIS — J029 Acute pharyngitis, unspecified: Secondary | ICD-10-CM | POA: Diagnosis not present

## 2023-05-02 DIAGNOSIS — U071 COVID-19: Secondary | ICD-10-CM | POA: Diagnosis not present

## 2023-05-03 DIAGNOSIS — I10 Essential (primary) hypertension: Secondary | ICD-10-CM | POA: Diagnosis not present

## 2023-05-04 DIAGNOSIS — J029 Acute pharyngitis, unspecified: Secondary | ICD-10-CM | POA: Diagnosis not present

## 2023-05-05 DIAGNOSIS — J209 Acute bronchitis, unspecified: Secondary | ICD-10-CM | POA: Diagnosis not present

## 2023-05-06 DIAGNOSIS — R059 Cough, unspecified: Secondary | ICD-10-CM | POA: Diagnosis not present

## 2023-05-09 DIAGNOSIS — J209 Acute bronchitis, unspecified: Secondary | ICD-10-CM | POA: Diagnosis not present

## 2023-05-10 DIAGNOSIS — I8001 Phlebitis and thrombophlebitis of superficial vessels of right lower extremity: Secondary | ICD-10-CM | POA: Diagnosis not present

## 2023-05-10 DIAGNOSIS — R609 Edema, unspecified: Secondary | ICD-10-CM | POA: Diagnosis not present

## 2023-05-10 DIAGNOSIS — J9601 Acute respiratory failure with hypoxia: Secondary | ICD-10-CM | POA: Diagnosis not present

## 2023-05-10 DIAGNOSIS — I10 Essential (primary) hypertension: Secondary | ICD-10-CM | POA: Diagnosis not present

## 2023-05-22 DIAGNOSIS — I87311 Chronic venous hypertension (idiopathic) with ulcer of right lower extremity: Secondary | ICD-10-CM | POA: Diagnosis not present

## 2023-05-22 DIAGNOSIS — I87333 Chronic venous hypertension (idiopathic) with ulcer and inflammation of bilateral lower extremity: Secondary | ICD-10-CM | POA: Diagnosis not present

## 2023-05-22 DIAGNOSIS — D649 Anemia, unspecified: Secondary | ICD-10-CM | POA: Diagnosis not present

## 2023-05-22 DIAGNOSIS — M6281 Muscle weakness (generalized): Secondary | ICD-10-CM | POA: Diagnosis not present

## 2023-05-22 DIAGNOSIS — I739 Peripheral vascular disease, unspecified: Secondary | ICD-10-CM | POA: Diagnosis not present

## 2023-05-22 DIAGNOSIS — L97212 Non-pressure chronic ulcer of right calf with fat layer exposed: Secondary | ICD-10-CM | POA: Diagnosis not present

## 2023-05-23 DIAGNOSIS — A09 Infectious gastroenteritis and colitis, unspecified: Secondary | ICD-10-CM | POA: Diagnosis not present

## 2023-05-24 DIAGNOSIS — R8290 Unspecified abnormal findings in urine: Secondary | ICD-10-CM | POA: Diagnosis not present

## 2023-05-24 DIAGNOSIS — I509 Heart failure, unspecified: Secondary | ICD-10-CM | POA: Diagnosis not present

## 2023-05-24 DIAGNOSIS — I10 Essential (primary) hypertension: Secondary | ICD-10-CM | POA: Diagnosis not present

## 2023-05-25 DIAGNOSIS — A0471 Enterocolitis due to Clostridium difficile, recurrent: Secondary | ICD-10-CM | POA: Diagnosis not present

## 2023-05-25 DIAGNOSIS — L97213 Non-pressure chronic ulcer of right calf with necrosis of muscle: Secondary | ICD-10-CM | POA: Diagnosis not present

## 2023-05-26 DIAGNOSIS — A0472 Enterocolitis due to Clostridium difficile, not specified as recurrent: Secondary | ICD-10-CM | POA: Diagnosis not present

## 2023-05-27 DIAGNOSIS — I10 Essential (primary) hypertension: Secondary | ICD-10-CM | POA: Diagnosis not present

## 2023-05-27 DIAGNOSIS — I501 Left ventricular failure: Secondary | ICD-10-CM | POA: Diagnosis not present

## 2023-05-29 DIAGNOSIS — D649 Anemia, unspecified: Secondary | ICD-10-CM | POA: Diagnosis not present

## 2023-05-29 DIAGNOSIS — R770 Abnormality of albumin: Secondary | ICD-10-CM | POA: Diagnosis not present

## 2023-05-29 DIAGNOSIS — M6281 Muscle weakness (generalized): Secondary | ICD-10-CM | POA: Diagnosis not present

## 2023-05-29 DIAGNOSIS — I87311 Chronic venous hypertension (idiopathic) with ulcer of right lower extremity: Secondary | ICD-10-CM | POA: Diagnosis not present

## 2023-05-29 DIAGNOSIS — L97212 Non-pressure chronic ulcer of right calf with fat layer exposed: Secondary | ICD-10-CM | POA: Diagnosis not present

## 2023-05-30 DIAGNOSIS — R600 Localized edema: Secondary | ICD-10-CM | POA: Diagnosis not present

## 2023-05-30 DIAGNOSIS — Z741 Need for assistance with personal care: Secondary | ICD-10-CM | POA: Diagnosis not present

## 2023-05-30 DIAGNOSIS — I10 Essential (primary) hypertension: Secondary | ICD-10-CM | POA: Diagnosis not present

## 2023-05-30 DIAGNOSIS — J9601 Acute respiratory failure with hypoxia: Secondary | ICD-10-CM | POA: Diagnosis not present

## 2023-05-30 DIAGNOSIS — R2681 Unsteadiness on feet: Secondary | ICD-10-CM | POA: Diagnosis not present

## 2023-05-30 DIAGNOSIS — M6281 Muscle weakness (generalized): Secondary | ICD-10-CM | POA: Diagnosis not present

## 2023-05-30 DIAGNOSIS — L853 Xerosis cutis: Secondary | ICD-10-CM | POA: Diagnosis not present

## 2023-05-30 DIAGNOSIS — R41841 Cognitive communication deficit: Secondary | ICD-10-CM | POA: Diagnosis not present

## 2023-05-30 DIAGNOSIS — R262 Difficulty in walking, not elsewhere classified: Secondary | ICD-10-CM | POA: Diagnosis not present

## 2023-06-02 DIAGNOSIS — M6281 Muscle weakness (generalized): Secondary | ICD-10-CM | POA: Diagnosis not present

## 2023-06-02 DIAGNOSIS — L853 Xerosis cutis: Secondary | ICD-10-CM | POA: Diagnosis not present

## 2023-06-02 DIAGNOSIS — R600 Localized edema: Secondary | ICD-10-CM | POA: Diagnosis not present

## 2023-06-02 DIAGNOSIS — Z741 Need for assistance with personal care: Secondary | ICD-10-CM | POA: Diagnosis not present

## 2023-06-02 DIAGNOSIS — R41841 Cognitive communication deficit: Secondary | ICD-10-CM | POA: Diagnosis not present

## 2023-06-02 DIAGNOSIS — R2681 Unsteadiness on feet: Secondary | ICD-10-CM | POA: Diagnosis not present

## 2023-06-02 DIAGNOSIS — I10 Essential (primary) hypertension: Secondary | ICD-10-CM | POA: Diagnosis not present

## 2023-06-02 DIAGNOSIS — J9601 Acute respiratory failure with hypoxia: Secondary | ICD-10-CM | POA: Diagnosis not present

## 2023-06-02 DIAGNOSIS — R262 Difficulty in walking, not elsewhere classified: Secondary | ICD-10-CM | POA: Diagnosis not present

## 2023-06-05 DIAGNOSIS — L853 Xerosis cutis: Secondary | ICD-10-CM | POA: Diagnosis not present

## 2023-06-05 DIAGNOSIS — M6281 Muscle weakness (generalized): Secondary | ICD-10-CM | POA: Diagnosis not present

## 2023-06-05 DIAGNOSIS — R262 Difficulty in walking, not elsewhere classified: Secondary | ICD-10-CM | POA: Diagnosis not present

## 2023-06-05 DIAGNOSIS — A0472 Enterocolitis due to Clostridium difficile, not specified as recurrent: Secondary | ICD-10-CM | POA: Diagnosis not present

## 2023-06-05 DIAGNOSIS — J9601 Acute respiratory failure with hypoxia: Secondary | ICD-10-CM | POA: Diagnosis not present

## 2023-06-05 DIAGNOSIS — I10 Essential (primary) hypertension: Secondary | ICD-10-CM | POA: Diagnosis not present

## 2023-06-05 DIAGNOSIS — R41841 Cognitive communication deficit: Secondary | ICD-10-CM | POA: Diagnosis not present

## 2023-06-05 DIAGNOSIS — Z741 Need for assistance with personal care: Secondary | ICD-10-CM | POA: Diagnosis not present

## 2023-06-05 DIAGNOSIS — D649 Anemia, unspecified: Secondary | ICD-10-CM | POA: Diagnosis not present

## 2023-06-05 DIAGNOSIS — R2681 Unsteadiness on feet: Secondary | ICD-10-CM | POA: Diagnosis not present

## 2023-06-05 DIAGNOSIS — I87311 Chronic venous hypertension (idiopathic) with ulcer of right lower extremity: Secondary | ICD-10-CM | POA: Diagnosis not present

## 2023-06-05 DIAGNOSIS — R600 Localized edema: Secondary | ICD-10-CM | POA: Diagnosis not present

## 2023-06-05 DIAGNOSIS — L97212 Non-pressure chronic ulcer of right calf with fat layer exposed: Secondary | ICD-10-CM | POA: Diagnosis not present

## 2023-06-06 DIAGNOSIS — R41841 Cognitive communication deficit: Secondary | ICD-10-CM | POA: Diagnosis not present

## 2023-06-06 DIAGNOSIS — R2681 Unsteadiness on feet: Secondary | ICD-10-CM | POA: Diagnosis not present

## 2023-06-06 DIAGNOSIS — R293 Abnormal posture: Secondary | ICD-10-CM | POA: Diagnosis not present

## 2023-06-06 DIAGNOSIS — Z741 Need for assistance with personal care: Secondary | ICD-10-CM | POA: Diagnosis not present

## 2023-06-06 DIAGNOSIS — I10 Essential (primary) hypertension: Secondary | ICD-10-CM | POA: Diagnosis not present

## 2023-06-06 DIAGNOSIS — J9601 Acute respiratory failure with hypoxia: Secondary | ICD-10-CM | POA: Diagnosis not present

## 2023-06-06 DIAGNOSIS — R262 Difficulty in walking, not elsewhere classified: Secondary | ICD-10-CM | POA: Diagnosis not present

## 2023-06-06 DIAGNOSIS — M6281 Muscle weakness (generalized): Secondary | ICD-10-CM | POA: Diagnosis not present

## 2023-06-06 DIAGNOSIS — Z23 Encounter for immunization: Secondary | ICD-10-CM | POA: Diagnosis not present

## 2023-06-07 DIAGNOSIS — M6281 Muscle weakness (generalized): Secondary | ICD-10-CM | POA: Diagnosis not present

## 2023-06-07 DIAGNOSIS — J9601 Acute respiratory failure with hypoxia: Secondary | ICD-10-CM | POA: Diagnosis not present

## 2023-06-07 DIAGNOSIS — Z741 Need for assistance with personal care: Secondary | ICD-10-CM | POA: Diagnosis not present

## 2023-06-07 DIAGNOSIS — R293 Abnormal posture: Secondary | ICD-10-CM | POA: Diagnosis not present

## 2023-06-07 DIAGNOSIS — R41841 Cognitive communication deficit: Secondary | ICD-10-CM | POA: Diagnosis not present

## 2023-06-07 DIAGNOSIS — I10 Essential (primary) hypertension: Secondary | ICD-10-CM | POA: Diagnosis not present

## 2023-06-07 DIAGNOSIS — R2681 Unsteadiness on feet: Secondary | ICD-10-CM | POA: Diagnosis not present

## 2023-06-07 DIAGNOSIS — R262 Difficulty in walking, not elsewhere classified: Secondary | ICD-10-CM | POA: Diagnosis not present

## 2023-06-07 DIAGNOSIS — Z23 Encounter for immunization: Secondary | ICD-10-CM | POA: Diagnosis not present

## 2023-06-08 DIAGNOSIS — J9601 Acute respiratory failure with hypoxia: Secondary | ICD-10-CM | POA: Diagnosis not present

## 2023-06-08 DIAGNOSIS — D649 Anemia, unspecified: Secondary | ICD-10-CM | POA: Diagnosis not present

## 2023-06-08 DIAGNOSIS — R262 Difficulty in walking, not elsewhere classified: Secondary | ICD-10-CM | POA: Diagnosis not present

## 2023-06-08 DIAGNOSIS — R293 Abnormal posture: Secondary | ICD-10-CM | POA: Diagnosis not present

## 2023-06-08 DIAGNOSIS — Z23 Encounter for immunization: Secondary | ICD-10-CM | POA: Diagnosis not present

## 2023-06-08 DIAGNOSIS — M6281 Muscle weakness (generalized): Secondary | ICD-10-CM | POA: Diagnosis not present

## 2023-06-08 DIAGNOSIS — I10 Essential (primary) hypertension: Secondary | ICD-10-CM | POA: Diagnosis not present

## 2023-06-08 DIAGNOSIS — R2681 Unsteadiness on feet: Secondary | ICD-10-CM | POA: Diagnosis not present

## 2023-06-08 DIAGNOSIS — A0471 Enterocolitis due to Clostridium difficile, recurrent: Secondary | ICD-10-CM | POA: Diagnosis not present

## 2023-06-08 DIAGNOSIS — R41841 Cognitive communication deficit: Secondary | ICD-10-CM | POA: Diagnosis not present

## 2023-06-08 DIAGNOSIS — E876 Hypokalemia: Secondary | ICD-10-CM | POA: Diagnosis not present

## 2023-06-08 DIAGNOSIS — Z741 Need for assistance with personal care: Secondary | ICD-10-CM | POA: Diagnosis not present

## 2023-06-12 DIAGNOSIS — L89153 Pressure ulcer of sacral region, stage 3: Secondary | ICD-10-CM | POA: Diagnosis not present

## 2023-06-12 DIAGNOSIS — M6281 Muscle weakness (generalized): Secondary | ICD-10-CM | POA: Diagnosis not present

## 2023-06-12 DIAGNOSIS — R52 Pain, unspecified: Secondary | ICD-10-CM | POA: Diagnosis not present

## 2023-06-12 DIAGNOSIS — S81801D Unspecified open wound, right lower leg, subsequent encounter: Secondary | ICD-10-CM | POA: Diagnosis not present

## 2023-06-12 DIAGNOSIS — D649 Anemia, unspecified: Secondary | ICD-10-CM | POA: Diagnosis not present

## 2023-06-13 DIAGNOSIS — R41841 Cognitive communication deficit: Secondary | ICD-10-CM | POA: Diagnosis not present

## 2023-06-13 DIAGNOSIS — Z741 Need for assistance with personal care: Secondary | ICD-10-CM | POA: Diagnosis not present

## 2023-06-13 DIAGNOSIS — J9601 Acute respiratory failure with hypoxia: Secondary | ICD-10-CM | POA: Diagnosis not present

## 2023-06-13 DIAGNOSIS — I10 Essential (primary) hypertension: Secondary | ICD-10-CM | POA: Diagnosis not present

## 2023-06-13 DIAGNOSIS — M6281 Muscle weakness (generalized): Secondary | ICD-10-CM | POA: Diagnosis not present

## 2023-06-13 DIAGNOSIS — L089 Local infection of the skin and subcutaneous tissue, unspecified: Secondary | ICD-10-CM | POA: Diagnosis not present

## 2023-06-13 DIAGNOSIS — Z23 Encounter for immunization: Secondary | ICD-10-CM | POA: Diagnosis not present

## 2023-06-13 DIAGNOSIS — R262 Difficulty in walking, not elsewhere classified: Secondary | ICD-10-CM | POA: Diagnosis not present

## 2023-06-13 DIAGNOSIS — R293 Abnormal posture: Secondary | ICD-10-CM | POA: Diagnosis not present

## 2023-06-13 DIAGNOSIS — R2681 Unsteadiness on feet: Secondary | ICD-10-CM | POA: Diagnosis not present

## 2023-06-14 DIAGNOSIS — R41841 Cognitive communication deficit: Secondary | ICD-10-CM | POA: Diagnosis not present

## 2023-06-14 DIAGNOSIS — R262 Difficulty in walking, not elsewhere classified: Secondary | ICD-10-CM | POA: Diagnosis not present

## 2023-06-14 DIAGNOSIS — Z741 Need for assistance with personal care: Secondary | ICD-10-CM | POA: Diagnosis not present

## 2023-06-14 DIAGNOSIS — I10 Essential (primary) hypertension: Secondary | ICD-10-CM | POA: Diagnosis not present

## 2023-06-14 DIAGNOSIS — R2681 Unsteadiness on feet: Secondary | ICD-10-CM | POA: Diagnosis not present

## 2023-06-14 DIAGNOSIS — M6281 Muscle weakness (generalized): Secondary | ICD-10-CM | POA: Diagnosis not present

## 2023-06-14 DIAGNOSIS — Z23 Encounter for immunization: Secondary | ICD-10-CM | POA: Diagnosis not present

## 2023-06-14 DIAGNOSIS — J9601 Acute respiratory failure with hypoxia: Secondary | ICD-10-CM | POA: Diagnosis not present

## 2023-06-14 DIAGNOSIS — R293 Abnormal posture: Secondary | ICD-10-CM | POA: Diagnosis not present

## 2023-06-15 DIAGNOSIS — R293 Abnormal posture: Secondary | ICD-10-CM | POA: Diagnosis not present

## 2023-06-15 DIAGNOSIS — M6281 Muscle weakness (generalized): Secondary | ICD-10-CM | POA: Diagnosis not present

## 2023-06-15 DIAGNOSIS — R2681 Unsteadiness on feet: Secondary | ICD-10-CM | POA: Diagnosis not present

## 2023-06-15 DIAGNOSIS — Z23 Encounter for immunization: Secondary | ICD-10-CM | POA: Diagnosis not present

## 2023-06-15 DIAGNOSIS — I10 Essential (primary) hypertension: Secondary | ICD-10-CM | POA: Diagnosis not present

## 2023-06-15 DIAGNOSIS — J9601 Acute respiratory failure with hypoxia: Secondary | ICD-10-CM | POA: Diagnosis not present

## 2023-06-15 DIAGNOSIS — R262 Difficulty in walking, not elsewhere classified: Secondary | ICD-10-CM | POA: Diagnosis not present

## 2023-06-15 DIAGNOSIS — R41841 Cognitive communication deficit: Secondary | ICD-10-CM | POA: Diagnosis not present

## 2023-06-15 DIAGNOSIS — Z741 Need for assistance with personal care: Secondary | ICD-10-CM | POA: Diagnosis not present

## 2023-06-17 DIAGNOSIS — Z043 Encounter for examination and observation following other accident: Secondary | ICD-10-CM | POA: Diagnosis not present

## 2023-06-19 DIAGNOSIS — L97212 Non-pressure chronic ulcer of right calf with fat layer exposed: Secondary | ICD-10-CM | POA: Diagnosis not present

## 2023-06-19 DIAGNOSIS — D649 Anemia, unspecified: Secondary | ICD-10-CM | POA: Diagnosis not present

## 2023-06-19 DIAGNOSIS — M6281 Muscle weakness (generalized): Secondary | ICD-10-CM | POA: Diagnosis not present

## 2023-06-19 DIAGNOSIS — I87311 Chronic venous hypertension (idiopathic) with ulcer of right lower extremity: Secondary | ICD-10-CM | POA: Diagnosis not present

## 2023-06-19 DIAGNOSIS — L89153 Pressure ulcer of sacral region, stage 3: Secondary | ICD-10-CM | POA: Diagnosis not present

## 2023-06-22 DIAGNOSIS — L97212 Non-pressure chronic ulcer of right calf with fat layer exposed: Secondary | ICD-10-CM | POA: Diagnosis not present

## 2023-06-22 DIAGNOSIS — L89153 Pressure ulcer of sacral region, stage 3: Secondary | ICD-10-CM | POA: Diagnosis not present

## 2023-06-26 DIAGNOSIS — D649 Anemia, unspecified: Secondary | ICD-10-CM | POA: Diagnosis not present

## 2023-06-26 DIAGNOSIS — M6281 Muscle weakness (generalized): Secondary | ICD-10-CM | POA: Diagnosis not present

## 2023-06-26 DIAGNOSIS — L97212 Non-pressure chronic ulcer of right calf with fat layer exposed: Secondary | ICD-10-CM | POA: Diagnosis not present

## 2023-06-26 DIAGNOSIS — L89153 Pressure ulcer of sacral region, stage 3: Secondary | ICD-10-CM | POA: Diagnosis not present

## 2023-06-26 DIAGNOSIS — Z5181 Encounter for therapeutic drug level monitoring: Secondary | ICD-10-CM | POA: Diagnosis not present

## 2023-06-26 DIAGNOSIS — I87311 Chronic venous hypertension (idiopathic) with ulcer of right lower extremity: Secondary | ICD-10-CM | POA: Diagnosis not present

## 2023-07-03 DIAGNOSIS — L97212 Non-pressure chronic ulcer of right calf with fat layer exposed: Secondary | ICD-10-CM | POA: Diagnosis not present

## 2023-07-03 DIAGNOSIS — D649 Anemia, unspecified: Secondary | ICD-10-CM | POA: Diagnosis not present

## 2023-07-03 DIAGNOSIS — I87311 Chronic venous hypertension (idiopathic) with ulcer of right lower extremity: Secondary | ICD-10-CM | POA: Diagnosis not present

## 2023-07-03 DIAGNOSIS — M6281 Muscle weakness (generalized): Secondary | ICD-10-CM | POA: Diagnosis not present

## 2023-07-10 DIAGNOSIS — M6281 Muscle weakness (generalized): Secondary | ICD-10-CM | POA: Diagnosis not present

## 2023-07-10 DIAGNOSIS — L97212 Non-pressure chronic ulcer of right calf with fat layer exposed: Secondary | ICD-10-CM | POA: Diagnosis not present

## 2023-07-10 DIAGNOSIS — I87311 Chronic venous hypertension (idiopathic) with ulcer of right lower extremity: Secondary | ICD-10-CM | POA: Diagnosis not present

## 2023-07-10 DIAGNOSIS — D649 Anemia, unspecified: Secondary | ICD-10-CM | POA: Diagnosis not present

## 2023-07-10 DIAGNOSIS — L89153 Pressure ulcer of sacral region, stage 3: Secondary | ICD-10-CM | POA: Diagnosis not present

## 2023-07-12 DIAGNOSIS — R41841 Cognitive communication deficit: Secondary | ICD-10-CM | POA: Diagnosis not present

## 2023-07-12 DIAGNOSIS — J9601 Acute respiratory failure with hypoxia: Secondary | ICD-10-CM | POA: Diagnosis not present

## 2023-07-12 DIAGNOSIS — I10 Essential (primary) hypertension: Secondary | ICD-10-CM | POA: Diagnosis not present

## 2023-07-12 DIAGNOSIS — Z741 Need for assistance with personal care: Secondary | ICD-10-CM | POA: Diagnosis not present

## 2023-07-15 DIAGNOSIS — I10 Essential (primary) hypertension: Secondary | ICD-10-CM | POA: Diagnosis not present

## 2023-07-18 DIAGNOSIS — B961 Klebsiella pneumoniae [K. pneumoniae] as the cause of diseases classified elsewhere: Secondary | ICD-10-CM | POA: Diagnosis not present

## 2023-07-18 DIAGNOSIS — I83012 Varicose veins of right lower extremity with ulcer of calf: Secondary | ICD-10-CM | POA: Diagnosis not present

## 2023-07-18 DIAGNOSIS — B9562 Methicillin resistant Staphylococcus aureus infection as the cause of diseases classified elsewhere: Secondary | ICD-10-CM | POA: Diagnosis not present

## 2023-07-21 DIAGNOSIS — B961 Klebsiella pneumoniae [K. pneumoniae] as the cause of diseases classified elsewhere: Secondary | ICD-10-CM | POA: Diagnosis not present

## 2023-07-21 DIAGNOSIS — I83012 Varicose veins of right lower extremity with ulcer of calf: Secondary | ICD-10-CM | POA: Diagnosis not present

## 2023-07-21 DIAGNOSIS — B9562 Methicillin resistant Staphylococcus aureus infection as the cause of diseases classified elsewhere: Secondary | ICD-10-CM | POA: Diagnosis not present

## 2023-07-24 DIAGNOSIS — L89153 Pressure ulcer of sacral region, stage 3: Secondary | ICD-10-CM | POA: Diagnosis not present

## 2023-07-24 DIAGNOSIS — D649 Anemia, unspecified: Secondary | ICD-10-CM | POA: Diagnosis not present

## 2023-07-24 DIAGNOSIS — M6281 Muscle weakness (generalized): Secondary | ICD-10-CM | POA: Diagnosis not present

## 2023-07-25 DIAGNOSIS — L519 Erythema multiforme, unspecified: Secondary | ICD-10-CM | POA: Diagnosis not present

## 2023-07-25 DIAGNOSIS — G4709 Other insomnia: Secondary | ICD-10-CM | POA: Diagnosis not present

## 2023-07-25 DIAGNOSIS — R454 Irritability and anger: Secondary | ICD-10-CM | POA: Diagnosis not present

## 2023-07-25 DIAGNOSIS — Z046 Encounter for general psychiatric examination, requested by authority: Secondary | ICD-10-CM | POA: Diagnosis not present

## 2023-07-31 DIAGNOSIS — D649 Anemia, unspecified: Secondary | ICD-10-CM | POA: Diagnosis not present

## 2023-07-31 DIAGNOSIS — M6281 Muscle weakness (generalized): Secondary | ICD-10-CM | POA: Diagnosis not present

## 2023-07-31 DIAGNOSIS — L89153 Pressure ulcer of sacral region, stage 3: Secondary | ICD-10-CM | POA: Diagnosis not present

## 2023-08-01 DIAGNOSIS — R52 Pain, unspecified: Secondary | ICD-10-CM | POA: Diagnosis not present

## 2023-08-01 DIAGNOSIS — S81801D Unspecified open wound, right lower leg, subsequent encounter: Secondary | ICD-10-CM | POA: Diagnosis not present

## 2023-08-04 DIAGNOSIS — L97212 Non-pressure chronic ulcer of right calf with fat layer exposed: Secondary | ICD-10-CM | POA: Diagnosis not present

## 2023-08-04 DIAGNOSIS — L89153 Pressure ulcer of sacral region, stage 3: Secondary | ICD-10-CM | POA: Diagnosis not present

## 2023-10-07 DEATH — deceased
